# Patient Record
Sex: Male | Born: 1965 | Race: Black or African American | Hispanic: No | Marital: Married | Smoking: Never smoker
Health system: Southern US, Community
[De-identification: ages and names within clinical notes are randomized; demographics above are authoritative.]

## PROBLEM LIST (undated history)

## (undated) ENCOUNTER — Ambulatory Visit (HOSPITAL_COMMUNITY): Admission: EM | Payer: BC Managed Care – PPO | Source: Home / Self Care

## (undated) DIAGNOSIS — I10 Essential (primary) hypertension: Secondary | ICD-10-CM

## (undated) HISTORY — PX: NO PAST SURGERIES: SHX2092

---

## 2018-03-02 DIAGNOSIS — Z3009 Encounter for other general counseling and advice on contraception: Secondary | ICD-10-CM | POA: Diagnosis not present

## 2018-03-02 DIAGNOSIS — Z1388 Encounter for screening for disorder due to exposure to contaminants: Secondary | ICD-10-CM | POA: Diagnosis not present

## 2018-03-02 DIAGNOSIS — Z23 Encounter for immunization: Secondary | ICD-10-CM | POA: Diagnosis not present

## 2018-03-02 DIAGNOSIS — Z206 Contact with and (suspected) exposure to human immunodeficiency virus [HIV]: Secondary | ICD-10-CM | POA: Diagnosis not present

## 2018-03-02 DIAGNOSIS — Z0000 Encounter for general adult medical examination without abnormal findings: Secondary | ICD-10-CM | POA: Diagnosis not present

## 2018-03-02 DIAGNOSIS — Z0389 Encounter for observation for other suspected diseases and conditions ruled out: Secondary | ICD-10-CM | POA: Diagnosis not present

## 2018-03-02 DIAGNOSIS — Z049 Encounter for examination and observation for unspecified reason: Secondary | ICD-10-CM | POA: Diagnosis not present

## 2018-04-15 ENCOUNTER — Ambulatory Visit (HOSPITAL_COMMUNITY)
Admission: EM | Admit: 2018-04-15 | Discharge: 2018-04-15 | Disposition: A | Discharge status: Home / Self Care | Payer: Medicaid Other | Attending: Internal Medicine | Admitting: Internal Medicine

## 2018-04-15 ENCOUNTER — Encounter (HOSPITAL_COMMUNITY): Payer: Self-pay | Admitting: *Deleted

## 2018-04-15 ENCOUNTER — Other Ambulatory Visit: Payer: Self-pay

## 2018-04-15 DIAGNOSIS — R51 Headache: Secondary | ICD-10-CM | POA: Diagnosis not present

## 2018-04-15 DIAGNOSIS — R519 Headache, unspecified: Secondary | ICD-10-CM

## 2018-04-15 MED ORDER — FLUORESCEIN SODIUM 1 MG OP STRP
ORAL_STRIP | OPHTHALMIC | Status: AC
  Filled 2018-04-15: qty 1

## 2018-04-15 MED ORDER — KETOROLAC TROMETHAMINE 60 MG/2ML IM SOLN
60.0000 mg | Freq: Once | INTRAMUSCULAR | Status: AC
  Administered 2018-04-15: 60 mg via INTRAMUSCULAR

## 2018-04-15 MED ORDER — FLUTICASONE PROPIONATE 50 MCG/ACT NA SUSP: 1.0000 | Freq: Every day | NASAL | 2 refills | Status: DC

## 2018-04-15 MED ORDER — KETOROLAC TROMETHAMINE 60 MG/2ML IM SOLN
INTRAMUSCULAR | Status: AC
  Filled 2018-04-15: qty 2

## 2018-04-15 MED ORDER — NAPROXEN 500 MG PO TABS: 500.0000 mg | ORAL_TABLET | Freq: Two times a day (BID) | ORAL | 0 refills | Status: DC

## 2018-04-15 MED ORDER — TETRACAINE HCL 0.5 % OP SOLN
OPHTHALMIC | Status: AC
  Filled 2018-04-15: qty 4

## 2018-04-15 NOTE — ED Notes (Signed)
translator equipment in use creating a delay for this patient

## 2018-04-15 NOTE — Discharge Instructions (Signed)
It was nice meeting you!!  We treated you for a headache.  For worsening or more severe symptoms please go to the ER.

## 2018-04-15 NOTE — ED Triage Notes (Signed)
Pt complains of severe headache and sharp right eye pain started yesterday. Pt states vision is not clear to right eye.

## 2018-04-17 ENCOUNTER — Emergency Department (HOSPITAL_COMMUNITY)
Admission: EM | Admit: 2018-04-17 | Discharge: 2018-04-17 | Disposition: A | Discharge status: Home / Self Care | Payer: Medicaid Other | Attending: Emergency Medicine | Admitting: Emergency Medicine

## 2018-04-17 ENCOUNTER — Encounter (HOSPITAL_COMMUNITY): Payer: Self-pay | Admitting: Emergency Medicine

## 2018-04-17 ENCOUNTER — Ambulatory Visit (HOSPITAL_COMMUNITY)
Admission: EM | Admit: 2018-04-17 | Discharge: 2018-04-17 | Disposition: A | Discharge status: Home / Self Care | Payer: Medicaid Other

## 2018-04-17 ENCOUNTER — Emergency Department (HOSPITAL_COMMUNITY): Payer: Medicaid Other

## 2018-04-17 DIAGNOSIS — H5711 Ocular pain, right eye: Secondary | ICD-10-CM

## 2018-04-17 DIAGNOSIS — J019 Acute sinusitis, unspecified: Secondary | ICD-10-CM | POA: Insufficient documentation

## 2018-04-17 DIAGNOSIS — H538 Other visual disturbances: Secondary | ICD-10-CM | POA: Diagnosis not present

## 2018-04-17 LAB — CBC WITH DIFFERENTIAL/PLATELET
ABS IMMATURE GRANULOCYTES: 0 10*3/uL (ref 0.0–0.1)
Basophils Absolute: 0 10*3/uL (ref 0.0–0.1)
Basophils Relative: 1 %
Eosinophils Absolute: 0.1 10*3/uL (ref 0.0–0.7)
Eosinophils Relative: 2 %
HEMATOCRIT: 48.1 % (ref 39.0–52.0)
Hemoglobin: 16.2 g/dL (ref 13.0–17.0)
IMMATURE GRANULOCYTES: 0 %
LYMPHS ABS: 2.9 10*3/uL (ref 0.7–4.0)
Lymphocytes Relative: 38 %
MCH: 31.5 pg (ref 26.0–34.0)
MCHC: 33.7 g/dL (ref 30.0–36.0)
MCV: 93.4 fL (ref 78.0–100.0)
MONOS PCT: 15 %
Monocytes Absolute: 1.1 10*3/uL — ABNORMAL HIGH (ref 0.1–1.0)
NEUTROS ABS: 3.3 10*3/uL (ref 1.7–7.7)
NEUTROS PCT: 44 %
Platelets: 154 10*3/uL (ref 150–400)
RBC: 5.15 MIL/uL (ref 4.22–5.81)
RDW: 12.1 % (ref 11.5–15.5)
WBC: 7.5 10*3/uL (ref 4.0–10.5)

## 2018-04-17 LAB — BASIC METABOLIC PANEL
ANION GAP: 9 (ref 5–15)
BUN: 13 mg/dL (ref 6–20)
CHLORIDE: 105 mmol/L (ref 98–111)
CO2: 25 mmol/L (ref 22–32)
Calcium: 9 mg/dL (ref 8.9–10.3)
Creatinine, Ser: 0.87 mg/dL (ref 0.61–1.24)
GFR calc non Af Amer: >60 mL/min (ref >60)
Glucose, Bld: 112 mg/dL — ABNORMAL HIGH (ref 70–99)
Potassium: 3.3 mmol/L — ABNORMAL LOW (ref 3.5–5.1)
Sodium: 139 mmol/L (ref 135–145)

## 2018-04-17 LAB — SEDIMENTATION RATE: Sed Rate: 8 mm/hr (ref 0–16)

## 2018-04-17 MED ORDER — TETRACAINE HCL 0.5 % OP SOLN
1.0000 [drp] | Freq: Once | OPHTHALMIC | Status: AC
  Administered 2018-04-17: 1 [drp] via OPHTHALMIC
  Filled 2018-04-17: qty 4

## 2018-04-17 MED ORDER — FLUORESCEIN SODIUM 1 MG OP STRP
1.0000 | ORAL_STRIP | Freq: Once | OPHTHALMIC | Status: AC
  Administered 2018-04-17: 1 via OPHTHALMIC
  Filled 2018-04-17: qty 1

## 2018-04-17 MED ORDER — AMOXICILLIN-POT CLAVULANATE 875-125 MG PO TABS: 1.0000 | ORAL_TABLET | Freq: Two times a day (BID) | ORAL | 0 refills | Status: AC

## 2018-04-17 MED ORDER — IBUPROFEN 400 MG PO TABS
600.0000 mg | ORAL_TABLET | Freq: Once | ORAL | Status: AC
  Administered 2018-04-17: 600 mg via ORAL
  Filled 2018-04-17: qty 1

## 2018-04-17 MED ORDER — AMOXICILLIN-POT CLAVULANATE 875-125 MG PO TABS
1.0000 | ORAL_TABLET | Freq: Once | ORAL | Status: AC
  Administered 2018-04-17: 1 via ORAL
  Filled 2018-04-17: qty 1

## 2018-04-17 MED ORDER — HYDROCODONE-ACETAMINOPHEN 5-325 MG PO TABS
1.0000 | ORAL_TABLET | Freq: Once | ORAL | Status: AC
  Administered 2018-04-17: 1 via ORAL
  Filled 2018-04-17: qty 1

## 2018-04-17 NOTE — Discharge Instructions (Addendum)
Today your MRI of your head showed that you have a severe sinus infection.  This is causing you to have pain and pressure around your right eye.  The MRI of your brain did not show any evidence of a stroke or other significant abnormalities.   Please keep taking the nasal spray you have from urgent care.   Please take Ibuprofen (Advil, motrin) and Tylenol (acetaminophen) to relieve your pain.  You may take up to 600 MG (3 pills) of normal strength ibuprofen every 8 hours as needed.  In between doses of ibuprofen you make take tylenol, up to 1,000 mg (two extra strength pills).  Do not take more than 3,000 mg tylenol in a 24 hour period.  Please check all medication labels as many medications such as pain and cold medications may contain tylenol.  Do not drink alcohol while taking these medications.  Do not take other NSAID'S while taking ibuprofen (such as aleve or naproxen).  Please take ibuprofen with food to decrease stomach upset.  Today you received medications that may make you sleepy or impair your ability to make decisions.  For the next 24 hours please do not drive, operate heavy machinery, care for a small child with out another adult present, or perform any activities that may cause harm to you or someone else if you were to fall asleep or be impaired.   You may have diarrhea from the antibiotics.  It is very important that you continue to take the antibiotics even if you get diarrhea unless a medical professional tells you that you may stop taking them.  If you stop too early the bacteria you are being treated for will become stronger and you may need different, more powerful antibiotics that have more side effects and worsening diarrhea.  Please stay well hydrated and consider probiotics as they may decrease the severity of your diarrhea.

## 2018-04-17 NOTE — ED Notes (Signed)
Reports head hurts worse and feels like eye will pop[ out per translator service.  At prior visit instructions were to go to ed for any new or worsening symptoms.  Spoke to traci, np about patient.  Steward Drone, emt escorting patient to ed by wheelchair.

## 2018-04-17 NOTE — ED Provider Notes (Addendum)
[facility]    CSN: [id number] Arrival date & time: 04/15/18  1005     History   Chief Complaint Chief Complaint  Patient presents with  . Headache    HPI Bodie Crocco is a 52 y.o. male.   Patient is a 52 year old male that presents with severe right eye pain radiating into right side of head since yesterday about 9:00.  The pain has been worsening.  Describes it as a sharp stabbing pain 10 out of 10.  He denies any foreign body sensation.  He reports some blurred vision today in the eye.  He also reports some cold type symptoms with  congestion.  Denies any fever, chills, body aches.  Denies any injury to the eye.  He does wear glasses but does not have them today.  All information obtained using the translator. Pt not a very good historian. Hard to get a good H&P.   ROS per HPI      History reviewed. No pertinent past medical history.  There are no active problems to display for this patient.   History reviewed. No pertinent surgical history.     Home Medications    Prior to Admission medications   Medication Sig Start Date End Date Taking? Authorizing Provider  fluticasone (FLONASE) 50 MCG/ACT nasal spray Place 1 spray into both nostrils daily. 04/15/18   Dahlia Byes A, NP  naproxen (NAPROSYN) 500 MG tablet Take 1 tablet (500 mg total) by mouth 2 (two) times daily. 04/15/18   Janace Aris, NP    Family History History reviewed. No pertinent family history.  Social History Social History   Tobacco Use  . Smoking status: Not on file  Substance Use Topics  . Alcohol use: Not on file  . Drug use: Not on file     Allergies   Patient has no allergy information on record.   Review of Systems Review of Systems   Physical Exam Triage Vital Signs ED Triage Vitals  Enc Vitals Group     BP 04/15/18 1043 (!) 151/82     Pulse Rate 04/15/18 1043 82     Resp 04/15/18 1043 18     Temp 04/15/18 1043 98.2 F (36.8 C)     Temp Source 04/15/18  1043 Oral     SpO2 04/15/18 1043 99 %     Weight --      Height --      Head Circumference --      Peak Flow --      Pain Score 04/15/18 1049 9     Pain Loc --      Pain Edu? --      Excl. in GC? --    No data found.  Updated Vital Signs BP (!) 151/82   Pulse 82   Temp 98.2 F (36.8 C) (Oral)   Resp 18   SpO2 99%   Visual Acuity Right Eye Distance: patient could not determine even top line for visual acuity test Left Eye Distance:   Bilateral Distance:    Right Eye Near:   Left Eye Near:    Bilateral Near:     Physical Exam  Constitutional: He is oriented to person, place, and time. He appears well-developed and well-nourished.  Pt in distress with pain. Holding eye.   HENT:  Head: Normocephalic and atraumatic.  Eyes: Pupils are equal, round, and reactive to light. EOM are normal.  Bilateral scleral injection, more on the right. Tearing to right eye.  Pt pointing at head also.   Neck: Normal range of motion.  Pulmonary/Chest: Effort normal.  Musculoskeletal: Normal range of motion.  Neurological: He is alert and oriented to person, place, and time. He has normal strength. He is not disoriented. No sensory deficit.  No focal neuro deficits on exam. No facial droop. No obvious one sided weakness. Strength 4/5. Pt walking in room with normal gait.   Skin: Skin is warm and dry. Capillary refill takes less than 2 seconds.  Psychiatric: His mood appears anxious.  Nursing note and vitals reviewed.    UC Treatments / Results  Labs (all labs ordered are listed, but only abnormal results are displayed) Labs Reviewed - No data to display  EKG None  Radiology No results found.  Procedures Procedures (including critical care time)  Medications Ordered in UC Medications  ketorolac (TORADOL) injection 60 mg (60 mg Intramuscular Given 04/15/18 1136)    Initial Impression / Assessment and Plan / UC Course  I have reviewed the triage vital signs and the nursing  notes.  Pertinent labs & imaging results that were available during my care of the patient were reviewed by me and considered in my medical decision making (see chart for details).     Tetracaine eye drops applied to the right eye to prepare for fluorescence exam.  Went back in exam room to reassess and patient still having severe pain pointing to right and moving hand along right side of head where pain is radiating.  Toradol injection given for pain. Very hard to examine patient with translator. He keeps repeating himself when asking him questions.  Reassessed and pain  Decreased.  patient looking much more comfortable. Mostly likely cluster headache do to abscent foreign body sensation and sharp stabbing pain in the eye.  Pt also reporting cold type symptoms.  Flonase given to help if this is sinus and allergy related.  Naproxen for continued H/A.  No focal neuro deficits on exam.  No concern for stroke. Instructed ptto go to the ER for worsening symptoms.    All information obtained using the translator. Pt not a very good historian. Hard to get a hx.   Final Clinical Impressions(s) / UC Diagnoses   Final diagnoses:  Bad headache     Discharge Instructions     It was nice meeting you!!  We treated you for a headache.  For worsening or more severe symptoms please go to the ER.     ED Prescriptions    Medication Sig Dispense Auth. Provider   fluticasone (FLONASE) 50 MCG/ACT nasal spray Place 1 spray into both nostrils daily. 16 g Kashius Dominic A, NP   naproxen (NAPROSYN) 500 MG tablet Take 1 tablet (500 mg total) by mouth 2 (two) times daily. 30 tablet Dahlia Byes A, NP     Controlled Substance Prescriptions [place] Controlled Substance Registry consulted? Not Applicable        Janace Aris, NP 04/17/18 1032

## 2018-04-17 NOTE — ED Notes (Signed)
[facility] Spaniel #[id number] translator

## 2018-04-17 NOTE — ED Triage Notes (Signed)
Pt reports sided eye pain since Friday, seen at [facility] Saturday for the same, was given some rx that did not help. Pt denies vision issues in right eye. Has hx of the same a few years ago, unsure of diagnosis

## 2018-04-17 NOTE — ED Provider Notes (Addendum)
Patient presents today for evaluation of right eye pain, I assumed care of this patient from Andre Melia, PA-C at shift change, please see her note for full H&P.  He has had a cold with pain around his right eye, and has a new eyelid droop on exam today.   Physical Exam  BP 134/89 (BP Location: Right Arm)   Pulse 81   Temp 97.6 F (36.4 C) (Oral)   Resp 16   SpO2 98%   Physical Exam  Constitutional: He is oriented to person, place, and time. He appears well-developed and well-nourished. No distress.  HENT:  Head: Normocephalic and atraumatic.  Eyes: Conjunctivae are normal. Right eye exhibits no discharge. Left eye exhibits no discharge. No scleral icterus.  Slight eye lid droop on right side.    Neck: Normal range of motion.  Cardiovascular: Normal rate and regular rhythm.  Pulmonary/Chest: Effort normal. No stridor. No respiratory distress.  Abdominal: He exhibits no distension.  Musculoskeletal: He exhibits no edema or deformity.  Neurological: He is alert and oriented to person, place, and time. He exhibits normal muscle tone.  Skin: Skin is warm and dry. He is not diaphoretic.  Psychiatric: He has a normal mood and affect. His behavior is normal.  Nursing note and vitals reviewed.   ED Course/Procedures   Clinical Course as of Apr 19 1018  Mon Apr 17, 2018  1139 Visual acuity right eye 20/50, left eye 20/20, 20/20 both.   [LM]  7925 52 year old male presents with complaint of pain in his right eye.  Interpreter used for history and physical exam.  Intraocular pressures are normal, cranial nerve exam unremarkable with the exception of ptosis of the right upper eyelid.  Case discussed with Dr. Jacqulyn Bath, ER attending, recommends consult with ophthalmology.  Case was discussed with ophthalmology on-call who recommends MRI, without vision complaints does not feel that this is necessarily an ophthalmology patient but is available if needed based on MRI findings.   [LM]  1452 Care  signed out to [place], [place] pending MRI reports.   [LM]  1544 Patient not in room.    [EH]    Clinical Course User Index [EH] Cristina Gong, PA-C [LM] Alden Hipp    Procedures  Mr Brain Wo Contrast  Result Date: 04/17/2018 CLINICAL DATA:  Headache. Right eye pain beginning yesterday. Blurred vision EXAM: MRI HEAD AND ORBITS WITHOUT CONTRAST TECHNIQUE: Multiplanar, multiecho pulse sequences of the brain and surrounding structures were obtained without intravenous contrast. Multiplanar, multiecho pulse sequences of the orbits and surrounding structures were obtained including fat saturation techniques, without intravenous contrast administration. COMPARISON:  None. FINDINGS: MRI HEAD FINDINGS Brain: Diffusion imaging does not show any acute or subacute infarction. Cerebellar tonsils extend 2 mm through the foramen magnum, at the limits of normal and not sufficient for diagnosis Chiari malformation. The brainstem and cerebellum are otherwise normal. Cerebral hemispheres are normal without evidence of old or acute infarction, mass lesion, hemorrhage, hydrocephalus or extra-axial collection. Vascular: Major vessels at the base of the brain show flow. Skull and upper cervical spine: Hypercellular marrow pattern of the cervical spine, usually benign. Other: See below for description of sinus disease. MRI ORBITS FINDINGS Orbits: Both globes appear normal. The examination suffers from rather considerable motion degradation, but I believe the optic nerves are normal. Extra-ocular muscles are normal. Orbital fat is normal. No abnormality is seen at either orbital apex. Visualized sinuses: The patient has inflammatory sinus disease of the right frontal sinus including  a completely opacified far lateral component. Additionally, the right ethmoid sinus is opacified. There is mucosal thickening of the maxillary sinuses. This sinus disease could relate to the patient's symptoms. Soft tissues: Other  regional soft tissues appear unremarkable. Limited intracranial: Negative as above. IMPRESSION: Normal appearance of the brain itself. Inflammatory disease of the paranasal sinuses most pronounced affecting the right frontal and right ethmoid sinuses, which could relate to the patient's symptoms. Orbital imaging is graded by motion, but I do not identify any intraorbital pathology. Electronically Signed   By: Paulina Fusi M.D.   On: 04/17/2018 15:16   Mr Birdie Hopes ZO Contrast  Result Date: 04/17/2018 CLINICAL DATA:  Headache. Right eye pain beginning yesterday. Blurred vision EXAM: MRI HEAD AND ORBITS WITHOUT CONTRAST TECHNIQUE: Multiplanar, multiecho pulse sequences of the brain and surrounding structures were obtained without intravenous contrast. Multiplanar, multiecho pulse sequences of the orbits and surrounding structures were obtained including fat saturation techniques, without intravenous contrast administration. COMPARISON:  None. FINDINGS: MRI HEAD FINDINGS Brain: Diffusion imaging does not show any acute or subacute infarction. Cerebellar tonsils extend 2 mm through the foramen magnum, at the limits of normal and not sufficient for diagnosis Chiari malformation. The brainstem and cerebellum are otherwise normal. Cerebral hemispheres are normal without evidence of old or acute infarction, mass lesion, hemorrhage, hydrocephalus or extra-axial collection. Vascular: Major vessels at the base of the brain show flow. Skull and upper cervical spine: Hypercellular marrow pattern of the cervical spine, usually benign. Other: See below for description of sinus disease. MRI ORBITS FINDINGS Orbits: Both globes appear normal. The examination suffers from rather considerable motion degradation, but I believe the optic nerves are normal. Extra-ocular muscles are normal. Orbital fat is normal. No abnormality is seen at either orbital apex. Visualized sinuses: The patient has inflammatory sinus disease of the right  frontal sinus including a completely opacified far lateral component. Additionally, the right ethmoid sinus is opacified. There is mucosal thickening of the maxillary sinuses. This sinus disease could relate to the patient's symptoms. Soft tissues: Other regional soft tissues appear unremarkable. Limited intracranial: Negative as above. IMPRESSION: Normal appearance of the brain itself. Inflammatory disease of the paranasal sinuses most pronounced affecting the right frontal and right ethmoid sinuses, which could relate to the patient's symptoms. Orbital imaging is graded by motion, but I do not identify any intraorbital pathology. Electronically Signed   By: Paulina Fusi M.D.   On: 04/17/2018 15:16     MDM  Plan was to follow-up on MRIs of orbits and brain.  MRIs showed no significant intracranial abnormalities, patient does have extensive sinus disease, the right frontal sinus is inflamed with a completely opacified far lateral component along with right ethmoidal sinus opacification and mucosal thickening of the maxillary sinuses, suspect that this is the cause for patient's symptoms.  This patient was discussed with Dr. Jacqulyn Bath.  Will treat patient with p.o. Augmentin, and over-the-counter pain medicine.  Patient's diagnoses and treatment plans were discussed with the patient at length using Swahili video interpreter.  Return precautions were discussed with patient who states their understanding.  At the time of discharge patient denied any unaddressed complaints or concerns.  Patient is agreeable for discharge home.  Reports that he has a child who speaks both [place] and Swahili and can translate written English instructions for him.   Cristina Gong, [place] 04/17/18 1644    Maia Plan, MD 04/17/18 1953    Jeannie Fend, PA-C 04/19/18 1019  Maia Plan, MD 04/20/18 1319

## 2018-04-17 NOTE — ED Notes (Signed)
Translator needed

## 2018-04-17 NOTE — ED Notes (Signed)
Pt transported to MRI 

## 2018-04-17 NOTE — ED Provider Notes (Addendum)
[facility] EMERGENCY DEPARTMENT Provider Note   CSN: [id number] Arrival date & time: 04/17/18  0957     History   Chief Complaint Chief Complaint  Patient presents with  . Eye Problem    HPI Andre Wright is a 52 y.o. male.  52yo male presents to the ER, patient speaks swahili, interpreter used for visit. Patient was seen at Urgent Care 2 days ago (04/15/18), given tetracaine drops in the eye but pain persisted, was thought he had a headache related to his cold symptoms and was dc home with Flonase and Aleve.  Patient states he is sick today, pain in the right eye, shooting in nature, deep in the eye, pressure feeling. Onset Friday at 1AM while at work, got home and went to [facility] Saturday Morning, took medications as prescribed, pain increased yesterday. 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 New right lower eye lid droop. Denies any other pain or complaints (denies cough, cold, congestion, headache).     History reviewed. No pertinent past medical history.  There are no active problems to display for this patient.   No past surgical history on file.      Home Medications    Prior to Admission medications   Medication Sig Start Date End Date Taking? Authorizing Provider  amoxicillin-clavulanate (AUGMENTIN) 875-125 MG tablet Take 1 tablet by mouth every 12 (twelve) hours for 14 days. 04/17/18 05/01/18  Cristina Gong, PA-C  fluticasone [facility]) 50 MCG/ACT nasal spray Place 1 spray into both nostrils daily. 04/15/18   Dahlia Byes A, NP  naproxen (NAPROSYN) 500 MG tablet Take 1 tablet (500 mg total) by mouth 2 (two) times daily. 04/15/18   Janace Aris, NP    Family History No family history on file.  Social History Social History   Tobacco Use  . Smoking status: Not on file  Substance Use Topics  . Alcohol use: Not on file  . Drug use: Not on file      Allergies   Patient has no known allergies.   Review of Systems Review of Systems  Constitutional: Negative for chills and fever.  HENT: Negative for congestion, sinus pressure and sinus pain.   Eyes: Positive for pain. Negative for photophobia, discharge, redness, itching and visual disturbance.  Respiratory: Negative for cough.   Gastrointestinal: Negative for nausea and vomiting.  Musculoskeletal: Negative for neck pain and neck stiffness.  Skin: Negative for rash and wound.  Allergic/Immunologic: Negative for immunocompromised state.  Neurological: Positive for facial asymmetry. Negative for dizziness, speech difficulty, weakness and headaches.  Psychiatric/Behavioral: Negative for confusion.  All other systems reviewed and are negative.    Physical Exam Updated Vital Signs BP 134/89 (BP Location: Right Arm)   Pulse 81   Temp 97.6 F (36.4 C) (Oral)   Resp 16   SpO2 98%   Physical Exam  Constitutional: He is oriented to person, place, and time. He appears well-developed and well-nourished. No distress.  HENT:  Head: Atraumatic.  Right Ear: External ear normal.  Left Ear: External ear normal.  Nose: Nose normal.  Mouth/Throat: Oropharynx is clear and moist. No oropharyngeal exudate.  Eyes: Pupils are equal, round, and reactive to light. Conjunctivae and EOM are normal. Right eye exhibits no discharge. Left eye exhibits no discharge. Right conjunctiva is not injected. Right conjunctiva has no hemorrhage. Left conjunctiva is not injected. Left conjunctiva has no  hemorrhage. No scleral icterus. Right eye exhibits normal extraocular motion and no nystagmus. Left eye exhibits normal extraocular motion and no nystagmus.  Slit lamp exam:      The right eye shows no fluorescein uptake.  IOP 4 right and left. No fluorescein uptake, reports slight improvement in his pain with the tetracaine.   Neck: Normal range of motion. Neck supple.  Cardiovascular: Normal rate, regular  rhythm, normal heart sounds and intact distal pulses.  No murmur heard. Pulmonary/Chest: Effort normal and breath sounds normal. No respiratory distress.  Musculoskeletal: He exhibits no tenderness.  Neurological: He is alert and oriented to person, place, and time. He has normal strength. He is not disoriented. He displays normal reflexes. A cranial nerve deficit is present. No sensory deficit. GCS eye subscore is 4. GCS verbal subscore is 5. GCS motor subscore is 6.  Slight ptosis right upper eye lid  Skin: Skin is warm and dry. No rash noted. He is not diaphoretic.  Psychiatric: He has a normal mood and affect. His behavior is normal.  Nursing note and vitals reviewed.    ED Treatments / Results  Labs (all labs ordered are listed, but only abnormal results are displayed) Labs Reviewed  BASIC METABOLIC PANEL - Abnormal; Notable for the following components:      Result Value   Potassium 3.3 (*)    Glucose, Bld 112 (*)    All other components within normal limits  CBC WITH DIFFERENTIAL/PLATELET - Abnormal; Notable for the following components:   Monocytes Absolute 1.1 (*)    All other components within normal limits  SEDIMENTATION RATE    EKG None  Radiology No results found.  Procedures Procedures (including critical care time)  Medications Ordered in ED Medications  tetracaine (PONTOCAINE) 0.5 % ophthalmic solution 1 drop (1 drop Right Eye Given 04/17/18 1142)  fluorescein ophthalmic strip 1 strip (1 strip Right Eye Given 04/17/18 1142)  HYDROcodone-acetaminophen (NORCO/VICODIN) 5-325 MG per tablet 1 tablet (1 tablet Oral Given 04/17/18 1330)  ibuprofen (ADVIL,MOTRIN) tablet 600 mg (600 mg Oral Given 04/17/18 1652)  amoxicillin-clavulanate (AUGMENTIN) 875-125 MG per tablet 1 tablet (1 tablet Oral Given 04/17/18 1652)     Initial Impression / Assessment and Plan / ED Course  I have reviewed the triage vital signs and the nursing notes.  Pertinent labs & imaging results that  were available during my care of the patient were reviewed by me and considered in my medical decision making (see chart for details).  Clinical Course as of Apr 20 1808  [facility] Apr 17, 2018  1139 Visual acuity right eye 20/50, left eye 20/20, 20/20 both.   [LM]  7519 52 year old male presents with complaint of pain in his right eye.  Interpreter used for history and physical exam.  Intraocular pressures are normal, cranial nerve exam unremarkable with the exception of ptosis of the right upper eyelid.  Case discussed with Dr. Jacqulyn Bath, ER attending, recommends consult with ophthalmology.  Case was discussed with ophthalmology on-call who recommends MRI, without vision complaints does not feel that this is necessarily an ophthalmology patient but is available if needed based on MRI findings.   [LM]  1452 Care signed out to [place], [place] pending MRI reports.   [LM]  1544 Patient not in room.    [EH]    Clinical Course User Index [EH] Cristina Gong, PA-C [LM] Jeannie Fend, PA-C    Final Clinical Impressions(s) / ED Diagnoses   Final diagnoses:  Pain of right  eye  Acute sinusitis, recurrence not specified, unspecified location    ED Discharge Orders         Ordered    amoxicillin-clavulanate (AUGMENTIN) 875-125 MG tablet  Every 12 hours     04/17/18 1635           Jeannie Fend, PA-C 04/17/18 1453    Jeannie Fend, PA-C 04/20/18 1809    Long, Arlyss Repress, MD 04/21/18 1227

## 2018-04-17 NOTE — ED Notes (Signed)
ED Provider at bedside. 

## 2018-06-13 ENCOUNTER — Encounter: Payer: Self-pay | Admitting: Family Medicine

## 2018-06-13 ENCOUNTER — Ambulatory Visit (INDEPENDENT_AMBULATORY_CARE_PROVIDER_SITE_OTHER): Payer: Medicaid Other | Admitting: Family Medicine

## 2018-06-13 VITALS — BP 144/92 | HR 64 | Temp 97.6°F | Resp 17 | Ht 67.0 in | Wt 176.1 lb

## 2018-06-13 DIAGNOSIS — Z23 Encounter for immunization: Secondary | ICD-10-CM

## 2018-06-13 DIAGNOSIS — Z125 Encounter for screening for malignant neoplasm of prostate: Secondary | ICD-10-CM | POA: Diagnosis not present

## 2018-06-13 DIAGNOSIS — Z13220 Encounter for screening for lipoid disorders: Secondary | ICD-10-CM

## 2018-06-13 DIAGNOSIS — Z131 Encounter for screening for diabetes mellitus: Secondary | ICD-10-CM | POA: Diagnosis not present

## 2018-06-13 DIAGNOSIS — Z0000 Encounter for general adult medical examination without abnormal findings: Secondary | ICD-10-CM | POA: Diagnosis not present

## 2018-06-13 DIAGNOSIS — L309 Dermatitis, unspecified: Secondary | ICD-10-CM

## 2018-06-13 MED ORDER — TRIAMCINOLONE ACETONIDE 0.1 % EX CREA: 1.0000 "application " | TOPICAL_CREAM | Freq: Two times a day (BID) | CUTANEOUS | 0 refills | Status: DC

## 2018-06-13 MED ORDER — FLUTICASONE PROPIONATE 50 MCG/ACT NA SUSP: 1.0000 | Freq: Every day | NASAL | 1 refills | Status: AC

## 2018-06-13 NOTE — Patient Instructions (Signed)
Thank you for choosing [facility] at [facility] for your medical home!    Andre Wright was seen by Joaquin Courts, FNP today.   Andre Wright's primary care doctor is Bing Neighbors, FNP.   For the best care possible,  you should try to see Joaquin Courts, FNP-C  whenever you come to clinic.   We look forward to seeing you again soon!  If you have any questions about your visit today,  please call us at [phone]  Or feel free to reach your provider via MyChart.       Preventing Hypertension Hypertension, commonly called high blood pressure, is when the force of blood pumping through the arteries is too strong. Arteries are blood vessels that carry blood from the heart throughout the body. Over time, hypertension can damage the arteries and decrease blood flow to important parts of the body, including the brain, heart, and kidneys. Often, hypertension does not cause symptoms until blood pressure is very high. For this reason, it is important to have your blood pressure checked on a regular basis. Hypertension can often be prevented with diet and lifestyle changes. If you already have hypertension, you can control it with diet and lifestyle changes, as well as medicine. What nutrition changes can be made? Maintain a healthy diet. This includes:  Eating less salt (sodium). Ask your health care provider how much sodium is safe for you to have. The general recommendation is to consume less than 1 tsp (2,300 mg) of sodium a day. ? Do not add salt to your food. ? Choose low-sodium options when grocery shopping and eating out.  Limiting fats in your diet. You can do this by eating low-fat or fat-free dairy products and by eating less red meat.  Eating more fruits, vegetables, and whole grains. Make a goal to eat: ? 1-2 cups of fresh fruits and vegetables each day. ? 3-4 servings of whole grains each day.  Avoiding foods and beverages that have added  sugars.  Eating fish that contain healthy fats (omega-3 fatty acids), such as mackerel or salmon.  If you need help putting together a healthy eating plan, try the DASH diet. This diet is high in fruits, vegetables, and whole grains. It is low in sodium, red meat, and added sugars. DASH stands for Dietary Approaches to Stop Hypertension. What lifestyle changes can be made?  Lose weight if you are overweight. Losing just 3?5% of your body weight can help prevent or control hypertension. ? For example, if your present weight is 200 lb (91 kg), a loss of 3-5% of your weight means losing 6-10 lb (2.7-4.5 kg). ? Ask your health care provider to help you with a diet and exercise plan to safely lose weight.  Get enough exercise. Do at least 150 minutes of moderate-intensity exercise each week. ? You could do this in short exercise sessions several times a day, or you could do longer exercise sessions a few times a week. For example, you could take a brisk 10-minute walk or bike ride, 3 times a day, for 5 days a week.  Find ways to reduce stress, such as exercising, meditating, listening to music, or taking a yoga class. If you need help reducing stress, ask your health care provider.  Do not smoke. This includes e-cigarettes. Chemicals in tobacco and nicotine products raise your blood pressure each time you smoke. If you need help quitting, ask your health care provider.  Avoid alcohol. If you drink alcohol, limit alcohol  intake to no more than 1 drink a day for nonpregnant women and 2 drinks a day for men. One drink equals 12 oz of beer, 5 oz of wine, or 1 oz of hard liquor. Why are these changes important? Diet and lifestyle changes can help you prevent hypertension, and they may make you feel better overall and improve your quality of life. If you have hypertension, making these changes will help you control it and help prevent major complications, such as:  Hardening and narrowing of arteries  that supply blood to: ? Your heart. This can cause a heart attack. ? Your brain. This can cause a stroke. ? Your kidneys. This can cause kidney failure.  Stress on your heart muscle, which can cause heart failure.  What can I do to lower my risk?  Work with your health care provider to make a hypertension prevention plan that works for you. Follow your plan and keep all follow-up visits as told by your health care provider.  Learn how to check your blood pressure at home. Make sure that you know your personal target blood pressure, as told by your health care provider. How is this treated? In addition to diet and lifestyle changes, your health care provider may recommend medicines to help lower your blood pressure. You may need to try a few different medicines to find what works best for you. You also may need to take more than one medicine. Take over-the-counter and prescription medicines only as told by your health care provider. Where to find support: Your health care provider can help you prevent hypertension and help you keep your blood pressure at a healthy level. Your local hospital or your community may also provide support services and prevention programs. The American Heart Association offers an online support network at: https://www.lee.net/ Where to find more information: Learn more about hypertension from:  National Heart, Lung, and Blood Institute: https://www.peterson.org/  Centers for Disease Control and Prevention: AboutHD.co.nz  American Academy of Family Physicians: http://familydoctor.org/familydoctor/en/diseases-conditions/high-blood-pressure.printerview.all.html  Learn more about the DASH diet from:  National Heart, Lung, and Blood Institute: WedMap.it  Contact a health care provider if:  You think you are having a reaction to medicines you have  taken.  You have recurrent headaches or feel dizzy.  You have swelling in your ankles.  You have trouble with your vision. Summary  Hypertension often does not cause any symptoms until blood pressure is very high. It is important to get your blood pressure checked regularly.  Diet and lifestyle changes are the most important steps in preventing hypertension.  By keeping your blood pressure in a healthy range, you can prevent complications like heart attack, heart failure, stroke, and kidney failure.  Work with your health care provider to make a hypertension prevention plan that works for you. This information is not intended to replace advice given to you by your health care provider. Make sure you discuss any questions you have with your health care provider. Document Released: 08/10/2015 Document Revised: 04/05/2016 Document Reviewed: 04/05/2016 Elsevier Interactive Patient Education  [employer].

## 2018-06-13 NOTE — Progress Notes (Signed)
Andre Wright, is a 52 y.o. male  [id number]  [id number]  DOB - [birth year]  CC:  Chief Complaint  Patient presents with  . Establish Care    no concerns       HPI: Andre Wright is a 52 y.o. male is here today to establish care.   Andre Wright does not have a problem list on file.    Today's visit:  Patient was referred here by the social worker who is handling his immigration.  Patient is an immigrant from [place].  He denies knowledge of any previous health conditions.  No recent physical exam. No known family history of cardiovascular disease, hypertension, diabetes, or cancers.  My concern today is he is experiencing generalized itching.  Denies any known history of eczema or chronic dermatitis.  Reports no recent changes in detergents, foods, soaps, or exposure to plants.  He has not attempted relief with any over-the-counter preparations for itching. Patient denies fever, chills,new headaches, chest pain, abdominal pain, nausea, new weakness , numbness or tingling, SOB, edema, or worrisome cough.  He denies any other concerns or complaints today.  Current medications: Current Outpatient Medications:  .  fluticasone (FLONASE) 50 MCG/ACT nasal spray, Place 1 spray into both nostrils daily., Disp: 48 g, Rfl: 1 .  triamcinolone cream (KENALOG) 0.1 %, Apply 1 application topically 2 (two) times daily., Disp: 454 g, Rfl: 0   Pertinent family medical history: family history includes Healthy in his father and mother.   No Known Allergies  Social History   Socioeconomic History  . Marital status: Married    Spouse name: Not on file  . Number of children: Not on file  . Years of education: Not on file  . Highest education level: Not on file  Occupational History  . Not on file  Social Needs  . Financial resource strain: Not on file  . Food insecurity:    Worry: Not on file    Inability: Not on file  . Transportation needs:    Medical: Not on file    Non-medical:  Not on file  Tobacco Use  . Smoking status: Never Smoker  . Smokeless tobacco: Never Used  Substance and Sexual Activity  . Alcohol use: Never    Frequency: Never  . Drug use: Never  . Sexual activity: Yes  Lifestyle  . Physical activity:    Days per week: Not on file    Minutes per session: Not on file  . Stress: Not on file  Relationships  . Social connections:    Talks on phone: Not on file    Gets together: Not on file    Attends religious service: Not on file    Active member of club or organization: Not on file    Attends meetings of clubs or organizations: Not on file    Relationship status: Not on file  . Intimate partner violence:    Fear of current or ex partner: Not on file    Emotionally abused: Not on file    Physically abused: Not on file    Forced sexual activity: Not on file  Other Topics Concern  . Not on file  Social History Narrative  . Not on file    Review of Systems: Constitutional: Negative for fever, chills, diaphoresis, activity change, appetite change and fatigue. HENT: Negative for ear pain, nosebleeds, congestion, facial swelling, rhinorrhea, neck pain, neck stiffness and ear discharge.  Eyes: Negative for pain, discharge, redness, itching and visual disturbance. Respiratory: Negative for  cough, choking, chest tightness, shortness of breath, wheezing and stridor.  Cardiovascular: Negative for chest pain, palpitations and leg swelling. Gastrointestinal: Negative for abdominal distention. Genitourinary: Negative for dysuria, urgency, frequency, hematuria, flank pain, decreased urine volume, difficulty urinating. Musculoskeletal: Negative for back pain, joint swelling, arthralgia and gait problem. Neurological: Negative for dizziness, tremors, seizures, syncope, facial asymmetry, speech difficulty, weakness, light-headedness, numbness and headaches.  Hematological: Negative for adenopathy. Does not bruise/bleed easily. Psychiatric/Behavioral:  Negative for hallucinations, behavioral problems, confusion, dysphoric mood, decreased concentration and agitation.  Objective:   Vitals:   06/13/18 1124  BP: (!) 144/92  Pulse: 64  Resp: 17  Temp: 97.6 F (36.4 C)  SpO2: 96%    BP Readings from Last 3 Encounters:  06/13/18 (!) 144/92  04/17/18 134/89  04/17/18 (!) 155/84    Filed Weights   06/13/18 1124  Weight: 176 lb 2.4 oz (79.9 kg)      Physical Exam: Constitutional: Patient appears well-developed and well-nourished. No distress. HENT: Normocephalic, atraumatic, External right and left ear normal. Oropharynx is clear and moist.  Eyes: Conjunctivae and EOM are normal. PERRLA, no scleral icterus. Neck: Normal ROM. Neck supple. No JVD. No tracheal deviation. No thyromegaly. CVS: RRR, S1/S2 +, no murmurs, no gallops, no carotid bruit.  Pulmonary: Effort and breath sounds normal, no stridor, rhonchi, wheezes, rales.  Abdominal: Soft. BS +, no distension, tenderness, rebound or guarding.  Musculoskeletal: Normal range of motion. No edema and no tenderness.  Neuro: Alert. Normal muscle tone coordination. Normal gait. BUE and BLE strength 5/5. Bilateral hand grips symmetrical.No cranial nerve deficit. Skin: Skin is warm and dry. No rash noted. Not diaphoretic. No erythema. No pallor. Psychiatric: Normal mood and affect. Behavior, judgment, thought content normal.  Lab Results (prior encounters)  Lab Results  Component Value Date   WBC 7.5 04/17/2018   HGB 16.2 04/17/2018   HCT 48.1 04/17/2018   MCV 93.4 04/17/2018   PLT 154 04/17/2018   Lab Results  Component Value Date   CREATININE 0.87 04/17/2018   BUN 13 04/17/2018   NA 139 04/17/2018   K 3.3 (L) 04/17/2018   CL 105 04/17/2018   CO2 25 04/17/2018    No results found for: HGBA1C  No results found for: CHOL, TRIG, HDL, CHOLHDL, VLDL, LDLCALC      Assessment and plan:  1. Laboratory tests ordered as part of a complete physical exam (CPE) Blood pressure  slightly elevated today.  Patient reports no known history of hypertension.  For now we will continue to monitor blood pressure if it remains greater than 140/80 discussed with patient starting blood pressure medication.  Encourage routine physical activity and reducing salt intake. - Comprehensive metabolic panel - CBC with Differential - TSH  2. Screening PSA (prostate specific antigen) - PSA  3. Diabetes mellitus screening - Hemoglobin A1c  4. Lipid screening - Lipid Panel  5. Need for prophylactic vaccination and inoculation against influenza - Flu Vaccine QUAD 6+ mos PF IM (Fluarix Quad PF)  6. Dermatitis, new  -We will try triamcinolone cream 3 times daily as needed itching.   interpreter used to facilitate continuum of care and promote health.   Return 2 week BP check and 6 months routine wellness visit .   The patient was given clear instructions to go to ER or return to medical center if symptoms don't improve, worsen or new problems develop. The patient verbalized understanding. The patient was advised  to call and obtain lab results if they haven't  heard anything from out office within 7-10 business days.  Molli Barrows, FNP Primary Care at [facility] [address], [place] [address] [phone]: [phone]    This note has been created with Dragon speech recognition software and Engineer, materials. Any transcriptional errors are unintentional.

## 2018-06-15 LAB — CBC WITH DIFFERENTIAL/PLATELET
BASOS ABS: 0 10*3/uL (ref 0.0–0.2)
Basos: 0 %
EOS (ABSOLUTE): 0.2 10*3/uL (ref 0.0–0.4)
Eos: 4 %
Hematocrit: 46.9 % (ref 37.5–51.0)
Hemoglobin: 16.3 g/dL (ref 13.0–17.7)
IMMATURE GRANS (ABS): 0 10*3/uL (ref 0.0–0.1)
IMMATURE GRANULOCYTES: 0 %
Lymphocytes Absolute: 3.5 10*3/uL — ABNORMAL HIGH (ref 0.7–3.1)
Lymphs: 66 %
MCH: 31.2 pg (ref 26.6–33.0)
MCHC: 34.8 g/dL (ref 31.5–35.7)
MCV: 90 fL (ref 79–97)
MONOS ABS: 0.6 10*3/uL (ref 0.1–0.9)
Monocytes: 10 %
NEUTROS PCT: 20 %
Neutrophils Absolute: 1 10*3/uL — ABNORMAL LOW (ref 1.4–7.0)
PLATELETS: 180 10*3/uL (ref 150–450)
RBC: 5.23 x10E6/uL (ref 4.14–5.80)
RDW: 12.3 % (ref 12.3–15.4)
WBC: 5.3 10*3/uL (ref 3.4–10.8)

## 2018-06-15 LAB — COMPREHENSIVE METABOLIC PANEL
A/G RATIO: 1.4 (ref 1.2–2.2)
ALT: 36 IU/L (ref 0–44)
AST: 35 IU/L (ref 0–40)
Albumin: 4.1 g/dL (ref 3.5–5.5)
Alkaline Phosphatase: 111 IU/L (ref 39–117)
BILIRUBIN TOTAL: 0.4 mg/dL (ref 0.0–1.2)
BUN/Creatinine Ratio: 23 — ABNORMAL HIGH (ref 9–20)
BUN: 19 mg/dL (ref 6–24)
CHLORIDE: 102 mmol/L (ref 96–106)
CO2: 23 mmol/L (ref 20–29)
Calcium: 9.4 mg/dL (ref 8.7–10.2)
Creatinine, Ser: 0.82 mg/dL (ref 0.76–1.27)
GFR calc Af Amer: 118 mL/min/{1.73_m2} (ref >59)
GFR calc non Af Amer: 102 mL/min/{1.73_m2} (ref >59)
GLOBULIN, TOTAL: 2.9 g/dL (ref 1.5–4.5)
Glucose: 109 mg/dL — ABNORMAL HIGH (ref 65–99)
POTASSIUM: 4.3 mmol/L (ref 3.5–5.2)
SODIUM: 140 mmol/L (ref 134–144)
Total Protein: 7 g/dL (ref 6.0–8.5)

## 2018-06-15 LAB — HEMOGLOBIN A1C
Est. average glucose Bld gHb Est-mCnc: 114 mg/dL
HEMOGLOBIN A1C: 5.6 % (ref 4.8–5.6)

## 2018-06-15 LAB — PSA: Prostate Specific Ag, Serum: 0.6 ng/mL (ref 0.0–4.0)

## 2018-06-15 LAB — LIPID PANEL
Chol/HDL Ratio: 3 ratio (ref 0.0–5.0)
Cholesterol, Total: 106 mg/dL (ref 100–199)
HDL: 35 mg/dL — AB (ref >39)
LDL CALC: 53 mg/dL (ref 0–99)
TRIGLYCERIDES: 89 mg/dL (ref 0–149)
VLDL CHOLESTEROL CAL: 18 mg/dL (ref 5–40)

## 2018-06-15 LAB — TSH: TSH: 2.33 u[IU]/mL (ref 0.450–4.500)

## 2018-06-23 NOTE — Progress Notes (Signed)
Patient notified of results. Expressed understanding. Confirmed appointment for 11/19 @ 9:50.

## 2018-06-27 ENCOUNTER — Ambulatory Visit (INDEPENDENT_AMBULATORY_CARE_PROVIDER_SITE_OTHER): Payer: Medicaid Other | Admitting: Family Medicine

## 2018-06-27 VITALS — BP 147/87

## 2018-06-27 DIAGNOSIS — M25562 Pain in left knee: Secondary | ICD-10-CM

## 2018-06-27 DIAGNOSIS — M25561 Pain in right knee: Secondary | ICD-10-CM | POA: Diagnosis not present

## 2018-06-27 DIAGNOSIS — G4700 Insomnia, unspecified: Secondary | ICD-10-CM

## 2018-06-27 DIAGNOSIS — I10 Essential (primary) hypertension: Secondary | ICD-10-CM | POA: Diagnosis not present

## 2018-06-27 MED ORDER — LISINOPRIL 10 MG PO TABS: 10.0000 mg | ORAL_TABLET | Freq: Every day | ORAL | 3 refills | Status: DC

## 2018-06-27 NOTE — Patient Instructions (Addendum)
To aid in sleep, you may take diphenhydramine 25 mg one hour prior to bedtime. For knee pain, you may take ibuprofen 1-2 tablets over the counter every 8 hours as needed.    Hypertension Hypertension is another name for high blood pressure. High blood pressure forces your heart to work harder to pump blood. This can cause problems over time. There are two numbers in a blood pressure reading. There is a top number (systolic) over a bottom number (diastolic). It is best to have a blood pressure below 120/80. Healthy choices can help lower your blood pressure. You may need medicine to help lower your blood pressure if:  Your blood pressure cannot be lowered with healthy choices.  Your blood pressure is higher than 130/80.  Follow these instructions at home: Eating and drinking  If directed, follow the DASH eating plan. This diet includes: ? Filling half of your plate at each meal with fruits and vegetables. ? Filling one quarter of your plate at each meal with whole grains. Whole grains include whole wheat pasta, brown rice, and whole grain bread. ? Eating or drinking low-fat dairy products, such as skim milk or low-fat yogurt. ? Filling one quarter of your plate at each meal with low-fat (lean) proteins. Low-fat proteins include fish, skinless chicken, eggs, beans, and tofu. ? Avoiding fatty meat, cured and processed meat, or chicken with skin. ? Avoiding premade or processed food.  Eat less than 1,500 mg of salt (sodium) a day.  Limit alcohol use to no more than 1 drink a day for nonpregnant women and 2 drinks a day for men. One drink equals 12 oz of beer, 5 oz of wine, or 1 oz of hard liquor. Lifestyle  Work with your doctor to stay at a healthy weight or to lose weight. Ask your doctor what the best weight is for you.  Get at least 30 minutes of exercise that causes your heart to beat faster (aerobic exercise) most days of the week. This may include walking, swimming, or  biking.  Get at least 30 minutes of exercise that strengthens your muscles (resistance exercise) at least 3 days a week. This may include lifting weights or pilates.  Do not use any products that contain nicotine or tobacco. This includes cigarettes and e-cigarettes. If you need help quitting, ask your doctor.  Check your blood pressure at home as told by your doctor.  Keep all follow-up visits as told by your doctor. This is important. Medicines  Take over-the-counter and prescription medicines only as told by your doctor. Follow directions carefully.  Do not skip doses of blood pressure medicine. The medicine does not work as well if you skip doses. Skipping doses also puts you at risk for problems.  Ask your doctor about side effects or reactions to medicines that you should watch for. Contact a doctor if:  You think you are having a reaction to the medicine you are taking.  You have headaches that keep coming back (recurring).  You feel dizzy.  You have swelling in your ankles.  You have trouble with your vision. Get help right away if:  You get a very bad headache.  You start to feel confused.  You feel weak or numb.  You feel faint.  You get very bad pain in your: ? Chest. ? Belly (abdomen).  You throw up (vomit) more than once.  You have trouble breathing. Summary  Hypertension is another name for high blood pressure.  Making healthy choices can  help lower blood pressure. If your blood pressure cannot be controlled with healthy choices, you may need to take medicine. This information is not intended to replace advice given to you by your health care provider. Make sure you discuss any questions you have with your health care provider. Document Released: 01/12/2008 Document Revised: 06/23/2016 Document Reviewed: 06/23/2016 Elsevier Interactive Patient Education  [employer].

## 2018-06-28 NOTE — Progress Notes (Deleted)
Patient ID: Andre Wright, male    DOB: 15-Sep-1965, 52 y.o.   MRN: [id number]  PCP: Bing NeighborsHarris, Janika Jedlicka S, FNP  Chief Complaint  Patient presents with  . Blood Pressure Check    Subjective:  HPI  Andre Wright is a 52 y.o. male presents for blood pressure follow-up.   Social History   Socioeconomic History  . Marital status: Married    Spouse name: Not on file  . Number of children: Not on file  . Years of education: Not on file  . Highest education level: Not on file  Occupational History  . Not on file  Social Needs  . Financial resource strain: Not on file  . Food insecurity:    Worry: Not on file    Inability: Not on file  . Transportation needs:    Medical: Not on file    Non-medical: Not on file  Tobacco Use  . Smoking status: Never Smoker  . Smokeless tobacco: Never Used  Substance and Sexual Activity  . Alcohol use: Never    Frequency: Never  . Drug use: Never  . Sexual activity: Yes  Lifestyle  . Physical activity:    Days per week: Not on file    Minutes per session: Not on file  . Stress: Not on file  Relationships  . Social connections:    Talks on phone: Not on file    Gets together: Not on file    Attends religious service: Not on file    Active member of club or organization: Not on file    Attends meetings of clubs or organizations: Not on file    Relationship status: Not on file  . Intimate partner violence:    Fear of current or ex partner: Not on file    Emotionally abused: Not on file    Physically abused: Not on file    Forced sexual activity: Not on file  Other Topics Concern  . Not on file  Social History Narrative  . Not on file    Family History  Problem Relation Age of Onset  . Healthy Mother   . Healthy Father   . Stroke Neg Hx   . Heart disease Neg Hx   . Cancer Neg Hx      Review of Systems  There are no active problems to display for this patient.   No Known Allergies  Prior to Admission  medications   Medication Sig Start Date End Date Taking? Authorizing Provider  fluticasone (FLONASE) 50 MCG/ACT nasal spray Place 1 spray into both nostrils daily. 06/13/18   Bing NeighborsHarris, Keaghan Bowens S, FNP  lisinopril (PRINIVIL,ZESTRIL) 10 MG tablet Take 1 tablet (10 mg total) by mouth daily. 06/27/18   Bing NeighborsHarris, Josselyn Harkins S, FNP  triamcinolone cream (KENALOG) 0.1 % Apply 1 application topically 2 (two) times daily. 06/13/18   Bing NeighborsHarris, Tenise Stetler S, FNP    Past Medical, Surgical Family and Social History reviewed and updated.    Objective:   Today's Vitals   06/27/18 0943  BP: (!) 147/87    Wt Readings from Last 3 Encounters:  06/13/18 176 lb 2.4 oz (79.9 kg)     Physical Exam General appearance: alert, well developed, well nourished, cooperative and in no distress Head: Normocephalic, without obvious abnormality, atraumatic Respiratory: Respirations even and unlabored, normal respiratory rate Heart: rate and rhythm normal. No gallop or murmurs noted on exam  Extremities: No gross deformities Skin: Skin color, texture, turgor normal. No rashes seen  Psych: Appropriate mood and  affect. Neurologic: Mental status: Alert, oriented to person, place, and time, thought content appropriate.  No results found for: POCGLU  Lab Results  Component Value Date   HGBA1C 5.6 06/13/2018            Assessment & Plan:  1. Essential hypertension ***  2. Acute pain of both knees ***  3. Insomnia, unspecified type ***      -The patient was given clear instructions to go to ER or return to medical center if symptoms do not improve, worsen or new problems develop. The patient verbalized understanding.    Joaquin Courts, FNP Primary Care at [facility] [address], [place] [address] [phone]: [phone]

## 2018-07-03 NOTE — Progress Notes (Signed)
Subjective:  Andre Wright is a 52 y.o. male with elevated blood pressure readings x two visits. Aldan blood pressure has been checked > 4 times and readings have remained elevated greater than 140/80. He has no prior history of hypertension.  He denies chest pain, headaches, shortness of breath, dizziness or new weakness. He has no history of renal disease.  Current Outpatient Medications  Medication Sig Dispense Refill  . fluticasone (FLONASE) 50 MCG/ACT nasal spray Place 1 spray into both nostrils daily. 48 g 1  . lisinopril (PRINIVIL,ZESTRIL) 10 MG tablet Take 1 tablet (10 mg total) by mouth daily. 90 tablet 3  . triamcinolone cream (KENALOG) 0.1 % Apply 1 application topically 2 (two) times daily. 454 g 0   No current facility-administered medications for this visit.     Hypertension ROS: Pertinent negatives indicated in HPI.   Objective:  BP (!) 147/87   Appearance alert, well appearing, and in no distress. General exam BP noted to be well controlled today in office, S1, S2 normal, no gallop, no murmur, chest clear, no JVD, no HSM, no edema.  Lab review: labs are reviewed, up to date and normal.   Assessment/Plan:  Hypertension asymptomatic, uncontrolled. Initiating antihypertensive therapy with lisinopril 10 mg once daily.  We have discussed target BP range and blood pressure goal. I have advised patient to check BP regularly and to call us back or report to clinic if the numbers are consistently higher than 140/90. We discussed the importance of compliance with medical therapy and DASH diet recommended, consequences of uncontrolled hypertension discussed.   Meds ordered this encounter  Medications  . lisinopril (PRINIVIL,ZESTRIL) 10 MG tablet    Sig: Take 1 tablet (10 mg total) by mouth daily.    Dispense:  90 tablet    Refill:  3    Joaquin CourtsKimberly Sueo Cullen, FNP [facility] [address], [place] [id number] [phone]:  [phone]

## 2018-07-20 ENCOUNTER — Encounter (HOSPITAL_COMMUNITY): Payer: Self-pay | Admitting: Emergency Medicine

## 2018-07-20 ENCOUNTER — Ambulatory Visit (HOSPITAL_COMMUNITY)
Admission: EM | Admit: 2018-07-20 | Discharge: 2018-07-20 | Disposition: A | Discharge status: Home / Self Care | Payer: Medicaid Other | Attending: Family Medicine | Admitting: Family Medicine

## 2018-07-20 DIAGNOSIS — M25561 Pain in right knee: Secondary | ICD-10-CM | POA: Diagnosis not present

## 2018-07-20 DIAGNOSIS — N529 Male erectile dysfunction, unspecified: Secondary | ICD-10-CM | POA: Insufficient documentation

## 2018-07-20 HISTORY — DX: Essential (primary) hypertension: I10

## 2018-07-20 MED ORDER — NAPROXEN 500 MG PO TABS: 500.0000 mg | ORAL_TABLET | Freq: Two times a day (BID) | ORAL | 0 refills | Status: AC

## 2018-07-20 MED ORDER — SILDENAFIL CITRATE 50 MG PO TABS: ORAL_TABLET | ORAL | 0 refills | Status: AC

## 2018-07-20 NOTE — ED Notes (Signed)
Patient able to ambulate independently  

## 2018-07-20 NOTE — ED Provider Notes (Signed)
[facility]   [id number] 07/20/18 Arrival Time: [id number]  ASSESSMENT & PLAN:  1. Acute pain of right knee   2. Erectile dysfunction, unspecified erectile dysfunction type     Meds ordered this encounter  Medications  . naproxen (NAPROSYN) 500 MG tablet    Sig: Take 1 tablet (500 mg total) by mouth 2 (two) times daily. (Kumeza moja kidonge kila asubuhi na kila usiku kwa siku kumi.)    Dispense:  20 tablet    Refill:  0  . sildenafil (VIAGRA) 50 MG tablet    Sig: Take 1 tablet by mouth 1-4 hours before sexual activity. (Kumeza moja kidonge 1-4 masaa kabla ya shughuli ya ngono.)    Dispense:  10 tablet    Refill:  0  Trial of Viagra with understanding of need to f/u with PCP if this does not help.  Orders Placed This Encounter  Procedures  . Apply knee sleeve  To wear for the next 1-2 weeks with walking/standing.  Follow-up Information    Bing NeighborsHarris, Kimberly S, FNP.   Specialty:  Family Medicine Why:  If not improving over the next 1-2 weeks. Contact information: [address] [place] [address] [phone]        [facility].   Specialty:  Urgent Care Why:  As needed. Contact information: [address] [place] [id number] [phone]         Reviewed expectations re: course of current medical issues. Questions answered. Outlined signs and symptoms indicating need for more acute intervention. Patient verbalized understanding. After Visit Summary given.  SUBJECTIVE: History from: patient. Kiswahili video interpreter used.  Andre Wright is a 52 y.o. male who reports persistent mild to moderate pain of his right medial knee; described as aching with occasional sharp exacerbations, esp when climbing stairs; without radiation. Onset: gradual, over the past 1-2 weeks. Injury/trama: none identified. Symptoms have progressed to a point and plateaued since beginning. Aggravating factors:  prolonged walking/standing. Alleviating factors: rest. Associated symptoms: none reported. Extremity sensation changes or weakness: none. Self treatment: occasional iburprofen without much help. No locking or giving out of knee reported. History of similar: no.  Past Surgical History:  Procedure Laterality Date  . NO PAST SURGERIES      Also reports on/off inability to hold an erection over the past month or two. No stress/anxiety. Sexually active with his wife only. Normal libido. No new medications. No frequent alcohol use. No change in bowel/bladder habits. No significant fatigue. Weight stable. Sleeping well.  ROS: As per HPI. All other systems negative.   OBJECTIVE:  Vitals:   07/20/18 1003  BP: (!) 165/95  Pulse: 70  Resp: 18  Temp: 98.2 F (36.8 C)  TempSrc: Oral  SpO2: 100%    General appearance: alert; no distress CV: RRR without murmer Lungs: CTAB Extremities: . RLE: warm and well perfused; fairly well localized moderate tenderness over right medial knee (over MCL distribution); without gross deformities; with no swelling; with no bruising; ROM: normal; no clicks or laxity appreciated with exam CV: brisk extremity capillary refill of RLE; 2+ DP and PT pulse of RLE. Abd: soft; non-tender Skin: warm and dry; no visible rashes GU: deferred Neurologic: gait normal; normal reflexes of RLE and LLE; normal sensation of RLE and LLE; normal strength of RLE and LLE Psychological: alert and cooperative; normal mood and affect  No Known Allergies  Past Medical History:  Diagnosis Date  . Hypertension  Social History   Socioeconomic History  . Marital status: Married    Spouse name: Not on file  . Number of children: Not on file  . Years of education: Not on file  . Highest education level: Not on file  Occupational History  . Not on file  Social Needs  . Financial resource strain: Not on file  . Food insecurity:    Worry: Not on file    Inability: Not on  file  . Transportation needs:    Medical: Not on file    Non-medical: Not on file  Tobacco Use  . Smoking status: Never Smoker  . Smokeless tobacco: Never Used  Substance and Sexual Activity  . Alcohol use: Never    Frequency: Never  . Drug use: Never  . Sexual activity: Yes  Lifestyle  . Physical activity:    Days per week: Not on file    Minutes per session: Not on file  . Stress: Not on file  Relationships  . Social connections:    Talks on phone: Not on file    Gets together: Not on file    Attends religious service: Not on file    Active member of club or organization: Not on file    Attends meetings of clubs or organizations: Not on file    Relationship status: Not on file  Other Topics Concern  . Not on file  Social History Narrative  . Not on file   Family History  Problem Relation Age of Onset  . Healthy Mother   . Healthy Father   . Stroke Neg Hx   . Heart disease Neg Hx   . Cancer Neg Hx    Past Surgical History:  Procedure Laterality Date  . NO PAST SURGERIES        Mardella Layman, MD 07/20/18 1106

## 2018-07-20 NOTE — ED Triage Notes (Signed)
[facility], right knee pain, only when he is ambulating, x 2-3 days.  Patient also states that when he started taking Lisinopril in November he began to have issues performing and having a sex drive.

## 2018-07-27 ENCOUNTER — Ambulatory Visit (INDEPENDENT_AMBULATORY_CARE_PROVIDER_SITE_OTHER): Payer: Medicaid Other

## 2018-07-27 ENCOUNTER — Ambulatory Visit: Payer: Medicaid Other

## 2018-07-27 VITALS — BP 149/86

## 2018-07-27 DIAGNOSIS — Z23 Encounter for immunization: Secondary | ICD-10-CM | POA: Diagnosis not present

## 2018-07-27 DIAGNOSIS — I10 Essential (primary) hypertension: Secondary | ICD-10-CM

## 2018-07-27 MED ORDER — CLONIDINE HCL 0.1 MG PO TABS
0.1000 mg | ORAL_TABLET | Freq: Once | ORAL | Status: AC
  Administered 2018-07-27: 0.1 mg via ORAL

## 2018-07-27 NOTE — Progress Notes (Signed)
BP elevated today. Ordered 0.1 mg clonidine.  BP rechecked. Patient stable for discharge and will return in 4 weeks for nurse visit BP check.

## 2018-08-15 DIAGNOSIS — M25561 Pain in right knee: Secondary | ICD-10-CM | POA: Diagnosis not present

## 2018-08-28 ENCOUNTER — Ambulatory Visit (INDEPENDENT_AMBULATORY_CARE_PROVIDER_SITE_OTHER): Payer: Medicaid Other

## 2018-08-28 VITALS — BP 151/89 | HR 68

## 2018-08-28 DIAGNOSIS — I10 Essential (primary) hypertension: Secondary | ICD-10-CM | POA: Diagnosis not present

## 2018-08-28 MED ORDER — LISINOPRIL 10 MG PO TABS: 10.0000 mg | ORAL_TABLET | Freq: Every day | ORAL | 3 refills | Status: DC

## 2018-08-28 NOTE — Progress Notes (Signed)
Patient here for BP check. States that he has been out of his BP medication for about 1 month due to not having anybody who spoke English able to call pharmacy to request refills. Spoke with provider & she resent his prescriptions to the pharmacy with a note stating to place prescription on auto refill. Would like to see patient back in 4 weeks. KWalker, CMA.

## 2018-09-20 ENCOUNTER — Ambulatory Visit: Payer: Medicaid Other

## 2018-09-26 ENCOUNTER — Ambulatory Visit: Payer: Medicaid Other

## 2018-09-26 ENCOUNTER — Ambulatory Visit (INDEPENDENT_AMBULATORY_CARE_PROVIDER_SITE_OTHER): Payer: Medicaid Other | Admitting: Family Medicine

## 2018-09-26 VITALS — BP 157/87 | HR 66

## 2018-09-26 DIAGNOSIS — I10 Essential (primary) hypertension: Secondary | ICD-10-CM

## 2018-09-26 MED ORDER — LISINOPRIL 30 MG PO TABS: 30.0000 mg | ORAL_TABLET | Freq: Every day | ORAL | 2 refills | Status: DC

## 2018-09-26 NOTE — Progress Notes (Signed)
Assessment: Blood pressure (!) 157/87, pulse 66. Blood pressure remains uncontrolled. Increasing lisinopril 30 mg once daily.  Plan: Schedule a follow-up with provider in 6 weeks for evaluation of blood pressure.   Joaquin Courts, FNP-C

## 2018-09-26 NOTE — Progress Notes (Deleted)
Increasing lisinopril 30 mg once daily.  Schedule a follow-up with provider in 6 weeks for evaluation of blood pressure.

## 2018-09-26 NOTE — Progress Notes (Signed)
Patient here for BP check. Has taken BP medication this morning. Denies chest pain, shortness of breath, headaches & lower extremity swelling. Spoke with provider & she wants to increase medication to 30 mg daily & return in 6 weeks. Patient repeated back new dose instructions correctly. KWalker, CMA.

## 2018-09-26 NOTE — Patient Instructions (Addendum)
Increased Lisinopril 30 mg daily.  Take medication prior to next scheduled visit.   Hypertension Hypertension, commonly called high blood pressure, is when the force of blood pumping through the arteries is too strong. The arteries are the blood vessels that carry blood from the heart throughout the body. Hypertension forces the heart to work harder to pump blood and may cause arteries to become narrow or stiff. Having untreated or uncontrolled hypertension can cause heart attacks, strokes, kidney disease, and other problems. A blood pressure reading consists of a higher number over a lower number. Ideally, your blood pressure should be below 120/80. The first ("top") number is called the systolic pressure. It is a measure of the pressure in your arteries as your heart beats. The second ("bottom") number is called the diastolic pressure. It is a measure of the pressure in your arteries as the heart relaxes. What are the causes? The cause of this condition is not known. What increases the risk? Some risk factors for high blood pressure are under your control. Others are not. Factors you can change  Smoking.  Having type 2 diabetes mellitus, high cholesterol, or both.  Not getting enough exercise or physical activity.  Being overweight.  Having too much fat, sugar, calories, or salt (sodium) in your diet.  Drinking too much alcohol. Factors that are difficult or impossible to change  Having chronic kidney disease.  Having a family history of high blood pressure.  Age. Risk increases with age.  Race. You may be at higher risk if you are African-American.  Gender. Men are at higher risk than women before age 74. After age 60, women are at higher risk than men.  Having obstructive sleep apnea.  Stress. What are the signs or symptoms? Extremely high blood pressure (hypertensive crisis) may cause:  Headache.  Anxiety.  Shortness of breath.  Nosebleed.  Nausea and  vomiting.  Severe chest pain.  Jerky movements you cannot control (seizures). How is this diagnosed? This condition is diagnosed by measuring your blood pressure while you are seated, with your arm resting on a surface. The cuff of the blood pressure monitor will be placed directly against the skin of your upper arm at the level of your heart. It should be measured at least twice using the same arm. Certain conditions can cause a difference in blood pressure between your right and left arms. Certain factors can cause blood pressure readings to be lower or higher than normal (elevated) for a short period of time:  When your blood pressure is higher when you are in a health care provider's office than when you are at home, this is called white coat hypertension. Most people with this condition do not need medicines.  When your blood pressure is higher at home than when you are in a health care provider's office, this is called masked hypertension. Most people with this condition may need medicines to control blood pressure. If you have a high blood pressure reading during one visit or you have normal blood pressure with other risk factors:  You may be asked to return on a different day to have your blood pressure checked again.  You may be asked to monitor your blood pressure at home for 1 week or longer. If you are diagnosed with hypertension, you may have other blood or imaging tests to help your health care provider understand your overall risk for other conditions. How is this treated? This condition is treated by making healthy lifestyle changes, such  as eating healthy foods, exercising more, and reducing your alcohol intake. Your health care provider may prescribe medicine if lifestyle changes are not enough to get your blood pressure under control, and if:  Your systolic blood pressure is above 130.  Your diastolic blood pressure is above 80. Your personal target blood pressure may vary  depending on your medical conditions, your age, and other factors. Follow these instructions at home: Eating and drinking   Eat a diet that is high in fiber and potassium, and low in sodium, added sugar, and fat. An example eating plan is called the DASH (Dietary Approaches to Stop Hypertension) diet. To eat this way: ? Eat plenty of fresh fruits and vegetables. Try to fill half of your plate at each meal with fruits and vegetables. ? Eat whole grains, such as whole wheat pasta, brown rice, or whole grain bread. Fill about one quarter of your plate with whole grains. ? Eat or drink low-fat dairy products, such as skim milk or low-fat yogurt. ? Avoid fatty cuts of meat, processed or cured meats, and poultry with skin. Fill about one quarter of your plate with lean proteins, such as fish, chicken without skin, beans, eggs, and tofu. ? Avoid premade and processed foods. These tend to be higher in sodium, added sugar, and fat.  Reduce your daily sodium intake. Most people with hypertension should eat less than 1,500 mg of sodium a day.  Limit alcohol intake to no more than 1 drink a day for nonpregnant women and 2 drinks a day for men. One drink equals 12 oz of beer, 5 oz of Leth, or 1 oz of hard liquor. Lifestyle   Work with your health care provider to maintain a healthy body weight or to lose weight. Ask what an ideal weight is for you.  Get at least 30 minutes of exercise that causes your heart to beat faster (aerobic exercise) most days of the week. Activities may include walking, swimming, or biking.  Include exercise to strengthen your muscles (resistance exercise), such as pilates or lifting weights, as part of your weekly exercise routine. Try to do these types of exercises for 30 minutes at least 3 days a week.  Do not use any products that contain nicotine or tobacco, such as cigarettes and e-cigarettes. If you need help quitting, ask your health care provider.  Monitor your blood  pressure at home as told by your health care provider.  Keep all follow-up visits as told by your health care provider. This is important. Medicines  Take over-the-counter and prescription medicines only as told by your health care provider. Follow directions carefully. Blood pressure medicines must be taken as prescribed.  Do not skip doses of blood pressure medicine. Doing this puts you at risk for problems and can make the medicine less effective.  Ask your health care provider about side effects or reactions to medicines that you should watch for. Contact a health care provider if:  You think you are having a reaction to a medicine you are taking.  You have headaches that keep coming back (recurring).  You feel dizzy.  You have swelling in your ankles.  You have trouble with your vision. Get help right away if:  You develop a severe headache or confusion.  You have unusual weakness or numbness.  You feel faint.  You have severe pain in your chest or abdomen.  You vomit repeatedly.  You have trouble breathing. Summary  Hypertension is when the force of  blood pumping through your arteries is too strong. If this condition is not controlled, it may put you at risk for serious complications.  Your personal target blood pressure may vary depending on your medical conditions, your age, and other factors. For most people, a normal blood pressure is less than 120/80.  Hypertension is treated with lifestyle changes, medicines, or a combination of both. Lifestyle changes include weight loss, eating a healthy, low-sodium diet, exercising more, and limiting alcohol. This information is not intended to replace advice given to you by your health care provider. Make sure you discuss any questions you have with your health care provider. Document Released: 07/26/2005 Document Revised: 06/23/2016 Document Reviewed: 06/23/2016 Elsevier Interactive Patient Education  2019 [employer].

## 2018-11-07 ENCOUNTER — Ambulatory Visit: Payer: Medicaid Other | Admitting: Family Medicine

## 2019-01-24 IMAGING — MR MR ORBITS W/O CM
11 of 15 series · 30 of 48 positions shown · non-contrast
Comparison: None.

CLINICAL DATA: Headache. Right eye pain beginning yesterday.
Blurred vision

EXAM:
MRI HEAD AND ORBITS WITHOUT CONTRAST
TECHNIQUE: Multiplanar, multiecho pulse sequences of the brain and surrounding
structures were obtained without intravenous contrast. Multiplanar,
multiecho pulse sequences of the orbits and surrounding structures
were obtained including fat saturation techniques, without
intravenous contrast administration.

[Series 3: DWI · axial · 3.0mm · 0.94mm/px · z∈[-50,+96]mm · 7 of 100 slices shown (1 of 2)]
[im 1/100]
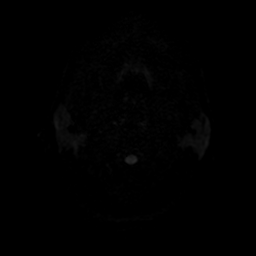
[im 17/100]
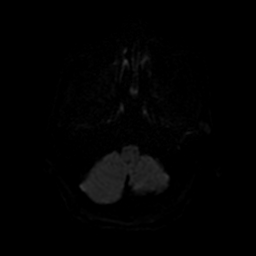
[im 34/100]
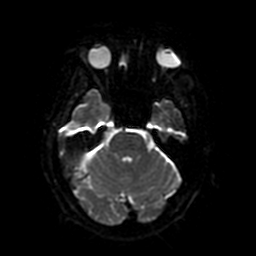
[im 50/100]
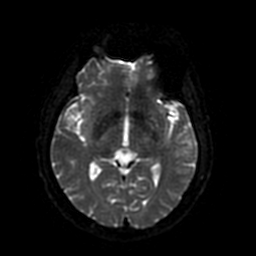
[im 67/100]
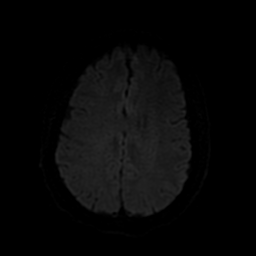
[im 83/100]
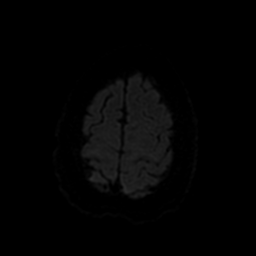
[im 100/100]
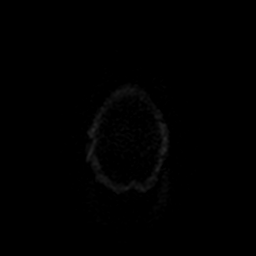

[Series 4: DWI · coronal · 4.0mm · 0.94mm/px · 5 of 71 slices shown (2 of 2)]
[im 1/71]
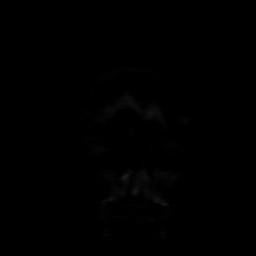
[im 18/71]
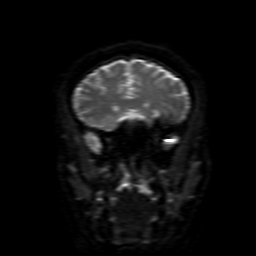
[im 36/71]
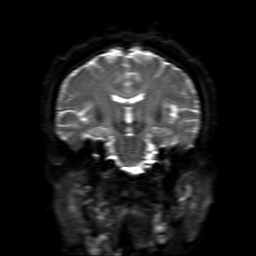
[im 53/71]
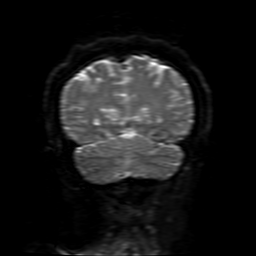
[im 71/71]
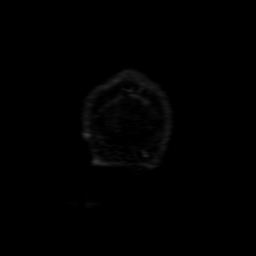

[Series 5: FLAIR · sagittal · 5.0mm · 0.47mm/px · 2 of 25 slices shown (1 of 2)]
[im 1/25]
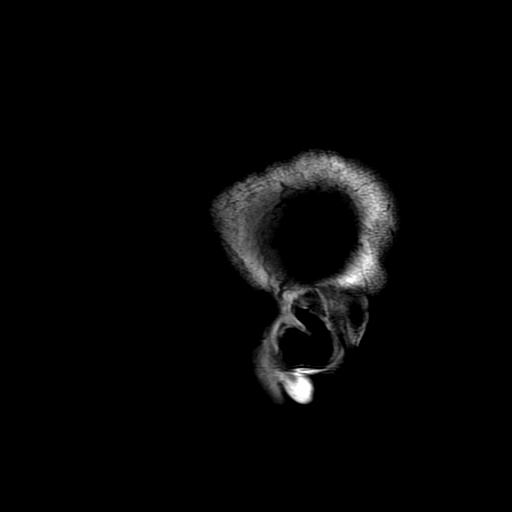
[im 25/25]
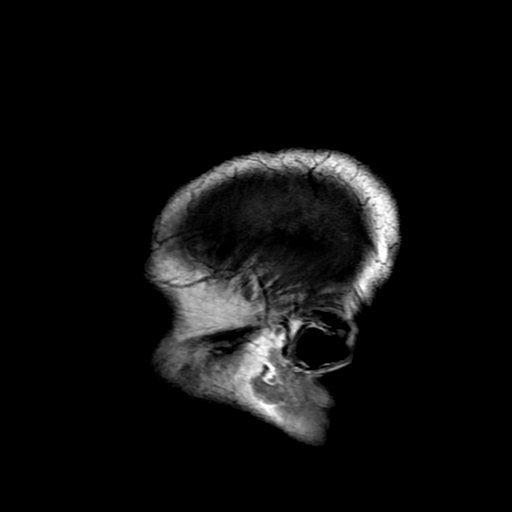

[Series 6: T2 · axial · 5.0mm · 0.47mm/px · z∈[-44,+105]mm · 2 of 26 slices shown (1 of 2)]
[im 1/26]
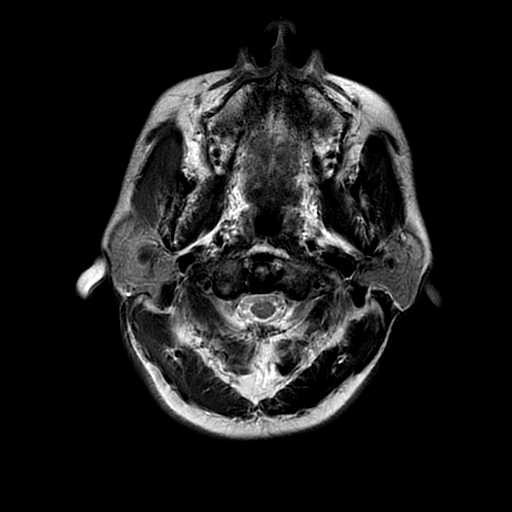
[im 26/26]
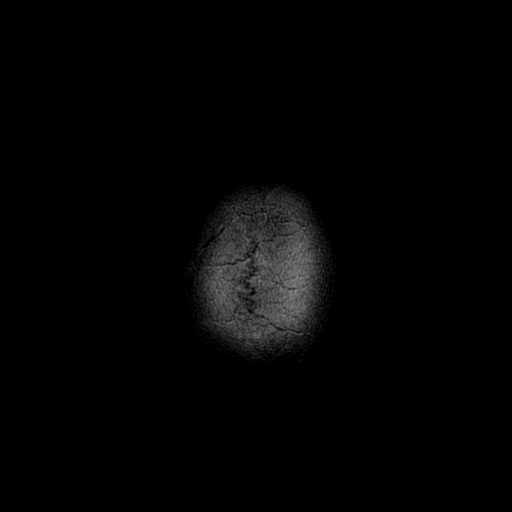

[Series 7: FLAIR · axial · 5.0mm · 0.47mm/px · z∈[-44,+105]mm · 2 of 26 slices shown (2 of 2)]
[im 1/26]
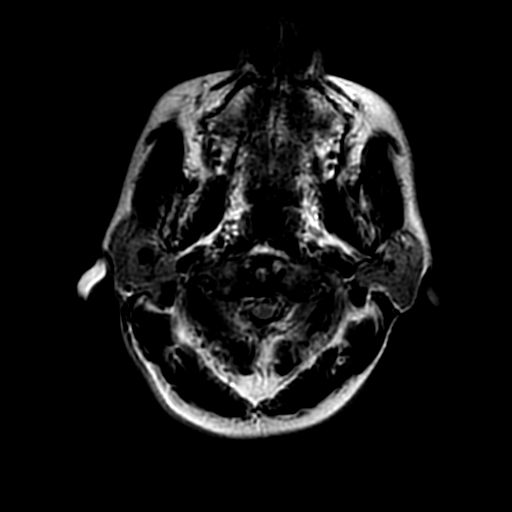
[im 26/26]
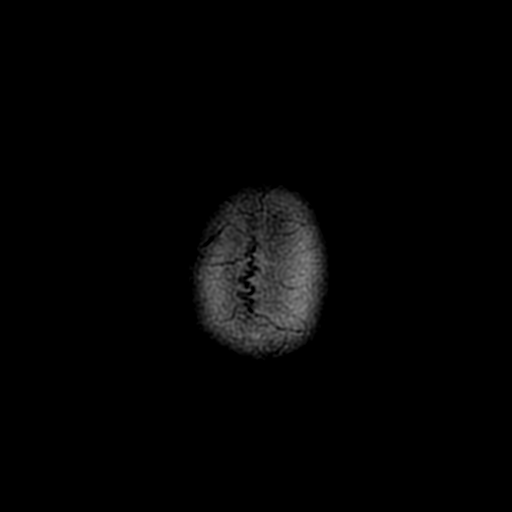

[Series 10: T2 · coronal · 5.0mm · 0.39mm/px · 2 of 32 slices shown (2 of 2)]
[im 1/32]
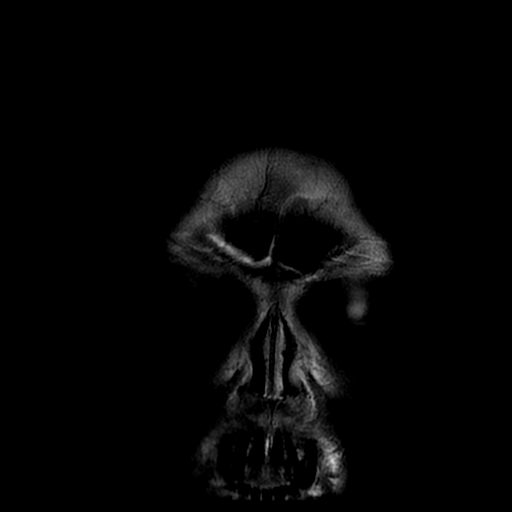
[im 32/32]
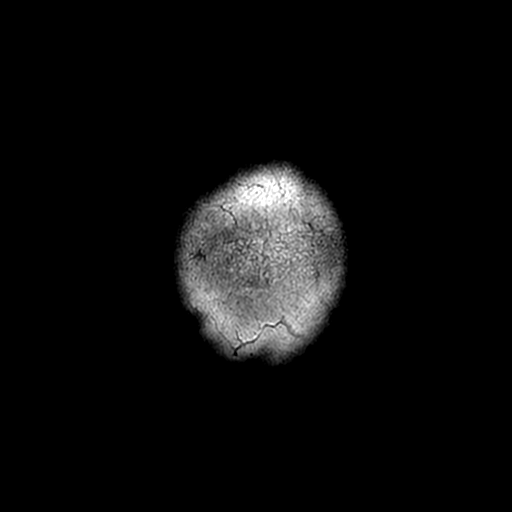

[Series 11: T2 fat-sat · coronal · 4.0mm · 0.35mm/px · 2 of 22 slices shown (1 of 2)]
[im 1/22]
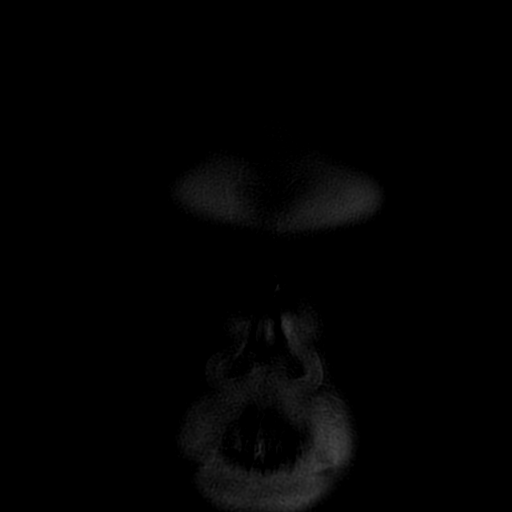
[im 22/22]
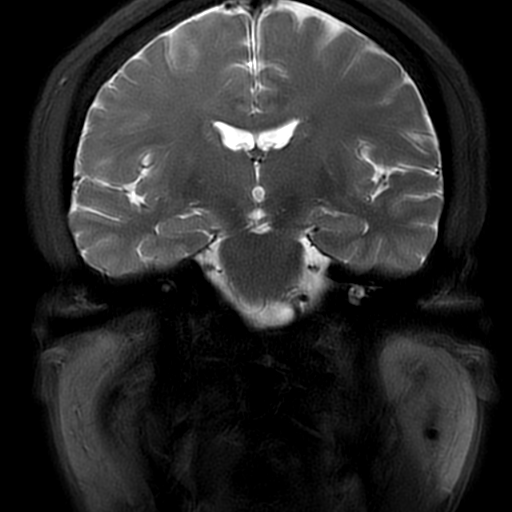

[Series 12: T2 fat-sat · axial · 3.0mm · 0.35mm/px · 1 of 20 slices shown (2 of 2)]
[im 1/20]
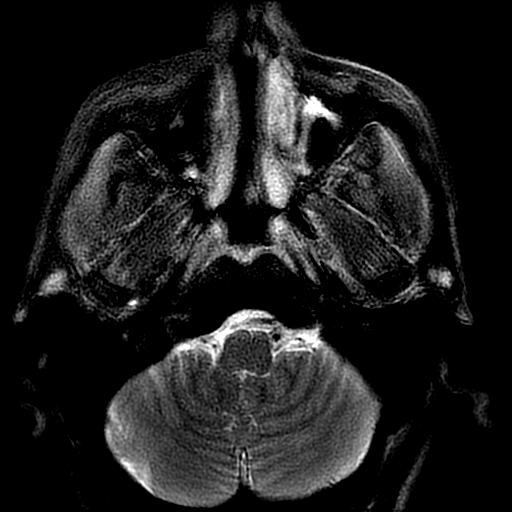

[Series 13: T1 · coronal · 4.0mm · 0.35mm/px · 2 of 22 slices shown]
[im 1/22]
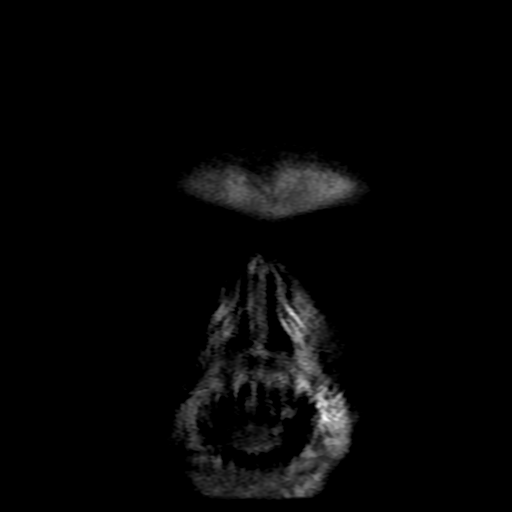
[im 22/22]
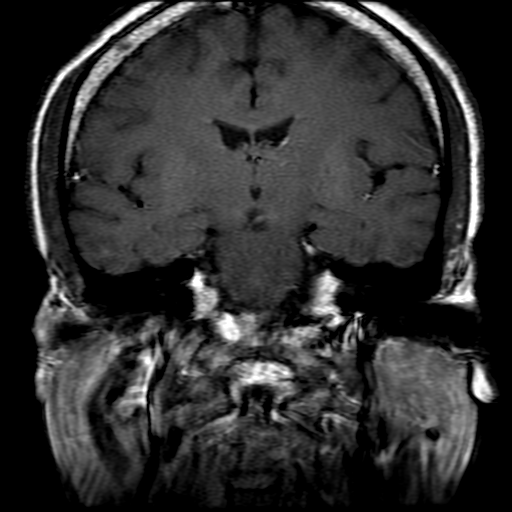

[Series 350: ADC · axial · 3.0mm · 0.94mm/px · z∈[-50,+96]mm · 3 of 49 slices shown (1 of 2)]
[im 1/49]
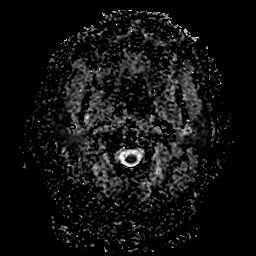
[im 25/49]
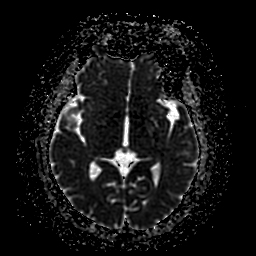
[im 49/49]
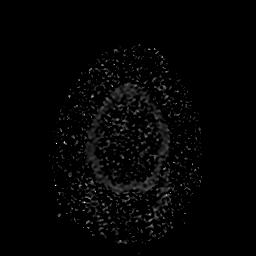

[Series 450: ADC · coronal · 4.0mm · 0.94mm/px · 2 of 36 slices shown (2 of 2)]
[im 1/36]
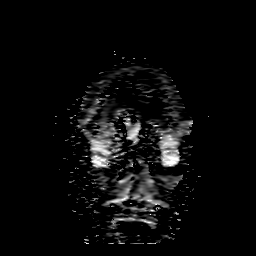
[im 36/36]
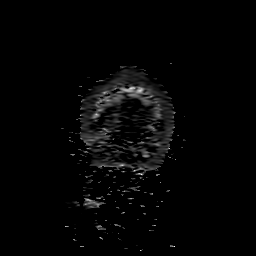

[30 of 48 positions shown; findings below may reference images not displayed]

FINDINGS: MRI HEAD FINDINGS

Brain: Diffusion imaging does not show any acute or subacute
infarction. Cerebellar tonsils extend 2 mm through the foramen
magnum, at the limits of normal and not sufficient for diagnosis
Chiari malformation. The brainstem and cerebellum are otherwise
normal. Cerebral hemispheres are normal without evidence of old or
acute infarction, mass lesion, hemorrhage, hydrocephalus or
extra-axial collection.

Vascular: Major vessels at the base of the brain show flow.

Skull and upper cervical spine: Hypercellular marrow pattern of the
cervical spine, usually benign.

Other: See below for description of sinus disease.

MRI ORBITS FINDINGS

Orbits: Both globes appear normal. The examination suffers from
rather considerable motion degradation, but I believe the optic
nerves are normal. Extra-ocular muscles are normal. Orbital fat is
normal. No abnormality is seen at either orbital apex.

Visualized sinuses: The patient has inflammatory sinus disease of
the right frontal sinus including a completely opacified far lateral
component. Additionally, the right ethmoid sinus is opacified. There
is mucosal thickening of the maxillary sinuses. This sinus disease
could relate to the patient's symptoms.

Soft tissues: Other regional soft tissues appear unremarkable.

Limited intracranial: Negative as above.
IMPRESSION: Normal appearance of the brain itself.

Inflammatory disease of the paranasal sinuses most pronounced
affecting the right frontal and right ethmoid sinuses, which could
relate to the patient's symptoms.

Orbital imaging is graded by motion, but I do not identify any
intraorbital pathology.

## 2019-01-29 ENCOUNTER — Other Ambulatory Visit: Payer: Self-pay

## 2019-01-29 ENCOUNTER — Ambulatory Visit (INDEPENDENT_AMBULATORY_CARE_PROVIDER_SITE_OTHER): Payer: BC Managed Care – PPO

## 2019-01-29 DIAGNOSIS — Z23 Encounter for immunization: Secondary | ICD-10-CM | POA: Diagnosis not present

## 2019-01-29 NOTE — Progress Notes (Signed)
Patient here for second Hepatitis A vaccine. While patient was here BP follow up appointment that was missed in March was rescheduled. KWalker, CMA.

## 2019-02-14 ENCOUNTER — Telehealth: Payer: Self-pay

## 2019-02-14 NOTE — Telephone Encounter (Signed)
Called patient to do their pre-visit COVID screening using [employer], [place].  Interpreter called the mobile number listed & stated that the person who answered didn't seem to understand [place] & hung up the phone. Interpreter called the number listed as home number & received an error message stating that "the number you are attempting to contact is unable to accept incoming calls at this time".

## 2019-02-15 ENCOUNTER — Ambulatory Visit: Payer: Medicaid Other | Admitting: Family Medicine

## 2022-11-06 ENCOUNTER — Ambulatory Visit (HOSPITAL_COMMUNITY)
Admission: EM | Admit: 2022-11-06 | Discharge: 2022-11-06 | Disposition: A | Discharge status: Home / Self Care | Payer: Medicaid Other | Attending: Internal Medicine | Admitting: Internal Medicine

## 2022-11-06 ENCOUNTER — Encounter (HOSPITAL_COMMUNITY): Payer: Self-pay

## 2022-11-06 ENCOUNTER — Telehealth (HOSPITAL_COMMUNITY): Payer: Self-pay | Admitting: Emergency Medicine

## 2022-11-06 DIAGNOSIS — S46912A Strain of unspecified muscle, fascia and tendon at shoulder and upper arm level, left arm, initial encounter: Secondary | ICD-10-CM

## 2022-11-06 MED ORDER — IBUPROFEN 600 MG PO TABS: 600.0000 mg | ORAL_TABLET | Freq: Four times a day (QID) | ORAL | 0 refills | Status: AC | PRN

## 2022-11-06 MED ORDER — METHOCARBAMOL 500 MG PO TABS: 500.0000 mg | ORAL_TABLET | Freq: Two times a day (BID) | ORAL | 0 refills | Status: DC

## 2022-11-06 MED ORDER — IBUPROFEN 600 MG PO TABS: 600.0000 mg | ORAL_TABLET | Freq: Four times a day (QID) | ORAL | 0 refills | Status: DC | PRN

## 2022-11-06 NOTE — Discharge Instructions (Signed)
Your pain is likely due to a muscle strain which will improve on its own with time.   - Take ibuprofen 600mg   with food every 6 hours as needed for pain and inflammation. Do not take any other NSAID containing medicine when taking ibuprofen.   - You may also take the prescribed muscle relaxer as directed as needed for muscle aches/spasm.  Do not take this medication and drive or drink alcohol as it can make you sleepy.  Mainly use this medicine at nighttime as needed. - Apply heat 20 minutes on then 20 minutes off and perform gentle range of motion exercises to the area of greatest pain to prevent muscle stiffness and provide further pain relief.   Schedule an appointment with the orthopedic doctor listed on your paperwork as needed.  Red flag symptoms to watch out for are numbness/tingling to the legs, weakness, loss of bowel/bladder control, and/or worsening pain that does not respond well to medicines. Follow-up with your primary care provider or return to urgent care if your symptoms do not improve in the next 3 to 4 days with medications and interventions recommended today. If your symptoms are severe (red flag), please go to the emergency room.  I hope you feel better!

## 2022-11-06 NOTE — ED Triage Notes (Signed)
Pt presents to office for back pain x 1 month. 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

## 2022-11-06 NOTE — ED Provider Notes (Addendum)
[place]    CSN: [id number] Arrival date & time: 11/06/22  1015      History   Chief Complaint Chief Complaint  Patient presents with   Shoulder Pain    HPI Andre Wright is a 57 y.o. male.   Patient presents to urgent care for evaluation of left shoulder pain that started last month.  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  No recent trauma/injury to the shoulder to his knowledge.  He is currently experiencing discomfort to the posterior left shoulder that is much worse with movement.  Denies neck pain, redness/warmth to the left shoulder, numbness and tingling to the bilateral upper extremities, and extremity weakness. He was given ibuprofen at his job and states that this helped a lot with the pain.  His manager at work requested that he come to urgent care to get a note stating that he cannot perform his job duties due to his shoulder pain.   The history is provided by the patient and a relative. The history is limited by a language barrier. A language interpreter was used (Swahili video interpreter used).  Shoulder Pain   Past Medical History:  Diagnosis Date   Hypertension     There are no problems to display for this patient.   Past Surgical History:  Procedure Laterality Date   NO PAST SURGERIES         Home Medications    Prior to Admission medications   Medication Sig Start Date End Date Taking? Authorizing Provider  fluticasone (FLONASE) 50 MCG/ACT nasal spray Place 1 spray into both nostrils daily. 06/13/18   Scot Jun, NP  ibuprofen (ADVIL) 600 MG tablet Take 1 tablet (600 mg total) by mouth every 6 (six) hours as needed. 11/06/22   Talbot Grumbling, FNP  lisinopril (PRINIVIL,ZESTRIL) 30 MG tablet Take 1 tablet (30 mg total) by mouth daily. 09/26/18   Scot Jun, NP   methocarbamol (ROBAXIN) 500 MG tablet Take 1 tablet (500 mg total) by mouth 2 (two) times daily. 11/06/22   Talbot Grumbling, FNP  naproxen (NAPROSYN) 500 MG tablet Take 1 tablet (500 mg total) by mouth 2 (two) times daily. (Kumeza moja kidonge kila asubuhi na kila usiku kwa siku kumi.) 07/20/18   Vanessa Kick, MD  sildenafil (VIAGRA) 50 MG tablet Take 1 tablet by mouth 1-4 hours before sexual activity. Alejandro Mulling moja kidonge 1-4 masaa kabla ya shughuli ya ngono.) 07/20/18   Vanessa Kick, MD    Family History Family History  Problem Relation Age of Onset   Healthy Mother    Healthy Father    Stroke Neg Hx    Heart disease Neg Hx    Cancer Neg Hx     Social History Social History   Tobacco Use   Smoking status: Never   Smokeless tobacco: Never  Substance Use Topics   Alcohol use: Never   Drug use: Never     Allergies   Patient has no known allergies.   Review of Systems Review of Systems Per HPI  Physical Exam Triage Vital Signs ED Triage Vitals  Enc Vitals Group     BP 11/06/22 1122 (!) 152/92     Pulse Rate 11/06/22 1122 63     Resp 11/06/22 1122 16     Temp 11/06/22 1122 98.6 F (37 C)  Temp Source 11/06/22 1122 Oral     SpO2 11/06/22 1122 98 %     Weight --      Height --      Head Circumference --      Peak Flow --      Pain Score 11/06/22 1129 6     Pain Loc --      Pain Edu? --      Excl. in [place]? --    No data found.  Updated Vital Signs BP (!) 152/92 (BP Location: Left Arm)   Pulse 63   Temp 98.6 F (37 C) (Oral)   Resp 16   SpO2 98%   Visual Acuity Right Eye Distance:   Left Eye Distance:   Bilateral Distance:    Right Eye Near:   Left Eye Near:    Bilateral Near:     Physical Exam Vitals and nursing note reviewed.  Constitutional:      Appearance: He is not ill-appearing or toxic-appearing.  HENT:     Head: Normocephalic and atraumatic.     Right Ear: Hearing and external ear normal.     Left Ear: Hearing and external  ear normal.     Nose: Nose normal.     Mouth/Throat:     Lips: Pink.  Eyes:     General: Lids are normal. Vision grossly intact. Gaze aligned appropriately.     Extraocular Movements: Extraocular movements intact.     Conjunctiva/sclera: Conjunctivae normal.  Cardiovascular:     Rate and Rhythm: Normal rate and regular rhythm.     Heart sounds: Normal heart sounds, S1 normal and S2 normal.  Pulmonary:     Effort: Pulmonary effort is normal. No respiratory distress.     Breath sounds: Normal breath sounds and air entry.  Musculoskeletal:     Right shoulder: Normal.     Left shoulder: Tenderness present. No swelling, deformity, effusion, laceration, bony tenderness or crepitus. Normal range of motion. Normal strength. Normal pulse.     Cervical back: Neck supple.     Comments: Normal range of motion of the left upper extremity.  Tender to palpation to the trigger of the posterior left shoulder.  Strength and sensation intact bilateral upper extremities.  Normal head movement at the neck without pain or crepitus.  No tenderness to the C, T, or L-spine.  No rash.  Skin:    General: Skin is warm and dry.     Capillary Refill: Capillary refill takes less than 2 seconds.     Findings: No rash.  Neurological:     General: No focal deficit present.     Mental Status: He is alert and oriented to person, place, and time. Mental status is at baseline.     Cranial Nerves: No dysarthria or facial asymmetry.  Psychiatric:        Mood and Affect: Mood normal.        Speech: Speech normal.        Behavior: Behavior normal.        Thought Content: Thought content normal.        Judgment: Judgment normal.      UC Treatments / Results  Labs (all labs ordered are listed, but only abnormal results are displayed) Labs Reviewed - No data to display  EKG   Radiology No results found.  Procedures Procedures (including critical care time)  Medications Ordered in UC Medications - No data to  display  Initial Impression / Assessment and Plan /  UC Course  I have reviewed the triage vital signs and the nursing notes.  Pertinent labs & imaging results that were available during my care of the patient were reviewed by me and considered in my medical decision making (see chart for details).   1.  Strain of left shoulder Presentation is consistent with acute muscle strain of the left shoulder due to overuse injury that will likely resolve with rest, fluids, as needed use of ibuprofen and muscle relaxer, heat, and gentle range of motion exercises. May take ibuprofen 600 mg every 6 hours and extend muscle relaxer every 12 hours as needed for muscle spasm. Drowsiness precautions regarding muscle relaxer use discussed. Heat and gentle ROM exercises discussed. Deferred imaging today based on stable musculoskeletal exam findings and hemodynamically stable vital signs. Walking referral given to orthopedic provider should symptoms fail to improve in the next 1-2 weeks.   Walking referral to orthopedics provided.  May call them to schedule an appointment and discuss work restrictions further as we are unable to provide this to urgent care due to lack of follow-up.   Discussed physical exam and available lab work findings in clinic with patient.  Counseled patient regarding appropriate use of medications and potential side effects for all medications recommended or prescribed today. Discussed red flag signs and symptoms of worsening condition,when to call the PCP office, return to urgent care, and when to seek higher level of care in the emergency department. Patient verbalizes understanding and agreement with plan. All questions answered. Patient discharged in stable condition.    Final Clinical Impressions(s) / UC Diagnoses   Final diagnoses:  Strain of left shoulder, initial encounter     Discharge Instructions      Your pain is likely due to a muscle strain which will improve on its own  with time.   - Take ibuprofen 600mg   with food every 6 hours as needed for pain and inflammation. Do not take any other NSAID containing medicine when taking ibuprofen.   - You may also take the prescribed muscle relaxer as directed as needed for muscle aches/spasm.  Do not take this medication and drive or drink alcohol as it can make you sleepy.  Mainly use this medicine at nighttime as needed. - Apply heat 20 minutes on then 20 minutes off and perform gentle range of motion exercises to the area of greatest pain to prevent muscle stiffness and provide further pain relief.   Schedule an appointment with the orthopedic doctor listed on your paperwork as needed.  Red flag symptoms to watch out for are numbness/tingling to the legs, weakness, loss of bowel/bladder control, and/or worsening pain that does not respond well to medicines. Follow-up with your primary care provider or return to urgent care if your symptoms do not improve in the next 3 to 4 days with medications and interventions recommended today. If your symptoms are severe (red flag), please go to the emergency room.  I hope you feel better!       ED Prescriptions     Medication Sig Dispense Auth. Provider   ibuprofen (ADVIL) 600 MG tablet Take 1 tablet (600 mg total) by mouth every 6 (six) hours as needed. 30 tablet Talbot Grumbling, FNP   methocarbamol (ROBAXIN) 500 MG tablet Take 1 tablet (500 mg total) by mouth 2 (two) times daily. 20 tablet Talbot Grumbling, FNP      PDMP not reviewed this encounter.   Talbot Grumbling, [place] 11/06/22 1235  Talbot Grumbling, [place] 11/06/22 1235

## 2022-12-13 ENCOUNTER — Encounter: Payer: Self-pay | Admitting: Family Medicine

## 2022-12-13 ENCOUNTER — Ambulatory Visit (INDEPENDENT_AMBULATORY_CARE_PROVIDER_SITE_OTHER): Payer: Medicaid Other | Admitting: Family Medicine

## 2022-12-13 VITALS — BP 161/93 | HR 69 | Temp 98.1°F | Resp 16 | Ht 67.0 in | Wt 180.4 lb

## 2022-12-13 DIAGNOSIS — Z789 Other specified health status: Secondary | ICD-10-CM

## 2022-12-13 DIAGNOSIS — Z7689 Persons encountering health services in other specified circumstances: Secondary | ICD-10-CM

## 2022-12-13 DIAGNOSIS — M25512 Pain in left shoulder: Secondary | ICD-10-CM

## 2022-12-13 DIAGNOSIS — I10 Essential (primary) hypertension: Secondary | ICD-10-CM | POA: Diagnosis not present

## 2022-12-13 MED ORDER — LISINOPRIL 30 MG PO TABS: 30.0000 mg | ORAL_TABLET | Freq: Every day | ORAL | 1 refills | Status: DC

## 2022-12-13 NOTE — Progress Notes (Unsigned)
Patient is here to re-established care with provider today. Patient has many health concern they would like to discuss with provider today  Care gaps discuss at appointment today

## 2022-12-13 NOTE — Progress Notes (Unsigned)
New Patient Office Visit  Subjective    Patient ID: Andre Wright, male    DOB: [birth year]  Age: 57 y.o. MRN: [id number]  CC: No chief complaint on file.   HPI Andre Wright presents to establish care and for review of chronic med issues. This visit was aided by an interpreter.    Outpatient Encounter Medications as of 12/13/2022  Medication Sig   fluticasone (FLONASE) 50 MCG/ACT nasal spray Place 1 spray into both nostrils daily.   ibuprofen (ADVIL) 600 MG tablet Take 1 tablet (600 mg total) by mouth every 6 (six) hours as needed.   lisinopril (PRINIVIL,ZESTRIL) 30 MG tablet Take 1 tablet (30 mg total) by mouth daily.   methocarbamol (ROBAXIN) 500 MG tablet Take 1 tablet (500 mg total) by mouth 2 (two) times daily.   naproxen (NAPROSYN) 500 MG tablet Take 1 tablet (500 mg total) by mouth 2 (two) times daily. (Kumeza moja kidonge kila asubuhi na kila usiku kwa siku kumi.)   sildenafil (VIAGRA) 50 MG tablet Take 1 tablet by mouth 1-4 hours before sexual activity. (Kumeza moja kidonge 1-4 masaa kabla ya shughuli ya ngono.)   No facility-administered encounter medications on file as of 12/13/2022.    Past Medical History:  Diagnosis Date   Hypertension     Past Surgical History:  Procedure Laterality Date   NO PAST SURGERIES      Family History  Problem Relation Age of Onset   Healthy Mother    Healthy Father    Stroke Neg Hx    Heart disease Neg Hx    Cancer Neg Hx     Social History   Socioeconomic History   Marital status: Married    Spouse name: Not on file   Number of children: Not on file   Years of education: Not on file   Highest education level: Not on file  Occupational History   Not on file  Tobacco Use   Smoking status: Never   Smokeless tobacco: Never  Substance and Sexual Activity   Alcohol use: Never   Drug use: Never   Sexual activity: Yes  Other Topics Concern   Not on file  Social History Narrative   Not on file   Social  Determinants of Health   Financial Resource Strain: Not on file  Food Insecurity: Not on file  Transportation Needs: Not on file  Physical Activity: Not on file  Stress: Not on file  Social Connections: Not on file  Intimate Partner Violence: Not on file    Review of Systems  All other systems reviewed and are negative.       Objective    BP (!) 161/93   Pulse 69   Temp 98.1 F (36.7 C) (Oral)   Resp 16   Ht 5\' 7"  (1.702 m)   Wt 180 lb 6.4 oz (81.8 kg)   SpO2 97%   BMI 28.25 kg/m   Physical Exam Vitals and nursing note reviewed.  Constitutional:      General: He is not in acute distress. Cardiovascular:     Rate and Rhythm: Normal rate and regular rhythm.  Pulmonary:     Effort: Pulmonary effort is normal.     Breath sounds: Normal breath sounds.  Abdominal:     Palpations: Abdomen is soft.     Tenderness: There is no abdominal tenderness.  Musculoskeletal:     Left shoulder: Tenderness present. No deformity. Decreased range of motion.  Neurological:     General:  No focal deficit present.     Mental Status: He is alert and oriented to person, place, and time.         Assessment & Plan:   1. Essential hypertension Elevated readings. Meds refilled. Discussed compliance.   2. Left shoulder pain, unspecified chronicity Referral to ortho for further eval/mgt - Ambulatory referral to Orthopedics  3. Encounter to establish care   4. Language barrier to communication     No follow-ups on file.   Tommie Raymond, MD

## 2022-12-16 ENCOUNTER — Encounter: Payer: Self-pay | Admitting: Family Medicine

## 2022-12-29 ENCOUNTER — Ambulatory Visit (INDEPENDENT_AMBULATORY_CARE_PROVIDER_SITE_OTHER): Payer: Medicaid Other | Admitting: Orthopedic Surgery

## 2022-12-29 ENCOUNTER — Other Ambulatory Visit (INDEPENDENT_AMBULATORY_CARE_PROVIDER_SITE_OTHER): Payer: Medicaid Other

## 2022-12-29 ENCOUNTER — Encounter: Payer: Self-pay | Admitting: Orthopedic Surgery

## 2022-12-29 ENCOUNTER — Other Ambulatory Visit: Payer: Self-pay

## 2022-12-29 DIAGNOSIS — M25512 Pain in left shoulder: Secondary | ICD-10-CM

## 2022-12-29 DIAGNOSIS — G8929 Other chronic pain: Secondary | ICD-10-CM

## 2022-12-29 NOTE — Progress Notes (Signed)
Office Visit Note   Patient: Andre Wright           Date of Birth: 18-Oct-1965           MRN: [id number] Visit Date: 12/29/2022 Requested by: Georganna Skeans, MD [address] [place],  [place] [address] PCP: Georganna Skeans, MD  Subjective: Chief Complaint  Patient presents with   Left Shoulder - Pain    HPI: Andre Wright is a 57 y.o. male who presents to the office reporting left shoulder and arm pain for 7 months.  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  Using ibuprofen with mild relief.  Really it is the work that bothers him.  Has less pain at home when he is not working.  States the pain runs down the arm.  Also reports scapular pain.  Denies any numbness and tingling.  He does report a lot of weakness in that left arm.  Has had no prior surgery to the neck or shoulder.  Here with a translator today..                ROS: All systems reviewed are negative as they relate to the chief complaint within the history of present illness.  Patient denies fevers or chills.  Assessment & Plan: Visit Diagnoses:  1. Chronic left shoulder pain     Plan: Impression is difficult exam and visit due to the language barrier; however, I think there is a possible radicular component to his symptoms particularly with the weakness and I think there may also be a shoulder issue.  Radiographs of the shoulder unremarkable but on the neck there is some loss of lordosis without much arthritis.  I think it is most likely he has a radicular component contributing to his relative weakness in the arm.  Again through the translator it is difficult to get a great exam for strength testing comparing left arm to right arm.  Nonetheless I do think he is got some biceps weakness a little triceps weakness as well as abduction weakness.  This is all on the left side compared to the right.  Plan for MRI cervical spine to evaluate left-sided  radiculopathy with 7 months of symptoms and failure of conservative treatment.  Also needs MRI arthrogram of the left shoulder to evaluate for possible rotator cuff tear which is really based more on his history and his work mechanisms of lifting.  Follow-up after those studies.  I did give him a work note which basically states no overhead work with the left arm for 4 weeks.  Follow-Up Instructions: No follow-ups on file.   Orders:  Orders Placed This Encounter  Procedures   XR Shoulder Left   No orders of the defined types were placed in this encounter.     Procedures: No procedures performed   Clinical Data: No additional findings.  Objective: Vital Signs: There were no vitals taken for this visit.  Physical Exam:  Constitutional: Patient appears well-developed HEENT:  Head: Normocephalic Eyes:EOM are normal Neck: Normal range of motion Cardiovascular: Normal rate Pulmonary/chest: Effort normal Neurologic: Patient is alert Skin: Skin is warm Psychiatric: Patient has normal mood and affect  Ortho Exam: Ortho exam demonstrates range of motion on the right passive of 80/120/180.  On the left is 80/90/130.  He does have a slightly tender 4 x 4 centimeter lipoma around the left axillary region about 8 fingerbreadths below the actual  axilla.  His external rotation strength is symmetric between arms at 15 degrees of abduction.  Biceps is 4 out of 5 on the left 5 out of 5 on the right.  Wrist flexion and extension also 4 out of 5 on the left compared to 5 out of 5 on the right.  He has active forward flexion and abduction below 90 degrees on that left-hand side.  I cannot really get him to relax enough to feel if there is any crepitus in the shoulder joint with internal and external rotation at 90 degrees of abduction on the left.  Specialty Comments:  No specialty comments available.  Imaging: XR Shoulder Left  Result Date: 12/29/2022 AP axillary outlet radiographs left  shoulder reviewed.  No acute fracture.  No glenohumeral joint arthritis.  Mild to moderate AC joint arthritis present.  Acromiohumeral distance maintained.  Visualized lung fields clear    PMFS History: There are no problems to display for this patient.  Past Medical History:  Diagnosis Date   Hypertension     Family History  Problem Relation Age of Onset   Healthy Mother    Healthy Father    Stroke Neg Hx    Heart disease Neg Hx    Cancer Neg Hx     Past Surgical History:  Procedure Laterality Date   NO PAST SURGERIES     Social History   Occupational History   Not on file  Tobacco Use   Smoking status: Never   Smokeless tobacco: Never  Substance and Sexual Activity   Alcohol use: Never   Drug use: Never   Sexual activity: Yes

## 2023-01-06 ENCOUNTER — Other Ambulatory Visit: Payer: Self-pay | Admitting: Orthopedic Surgery

## 2023-01-06 DIAGNOSIS — G8929 Other chronic pain: Secondary | ICD-10-CM

## 2023-01-20 ENCOUNTER — Ambulatory Visit: Payer: Medicaid Other | Admitting: Family Medicine

## 2023-02-15 ENCOUNTER — Telehealth: Payer: Self-pay | Admitting: Family Medicine

## 2023-02-15 NOTE — Telephone Encounter (Signed)
Copied from CRM [id number]. Topic: Referral - Status >> Feb 14, 2023 12:20 PM Ja-Kwan M wrote: Reason for CRM: Pt daughter called to request that an appt be scheduled with the specialist for pt shoulder

## 2023-02-21 ENCOUNTER — Ambulatory Visit: Payer: Medicaid Other | Admitting: Family Medicine

## 2023-02-21 ENCOUNTER — Encounter: Payer: Self-pay | Admitting: Family Medicine

## 2023-02-21 VITALS — BP 154/88 | HR 90 | Temp 98.1°F | Resp 16 | Wt 177.2 lb

## 2023-02-21 DIAGNOSIS — Z789 Other specified health status: Secondary | ICD-10-CM | POA: Diagnosis not present

## 2023-02-21 DIAGNOSIS — I10 Essential (primary) hypertension: Secondary | ICD-10-CM

## 2023-02-21 DIAGNOSIS — M25512 Pain in left shoulder: Secondary | ICD-10-CM

## 2023-02-21 MED ORDER — MELOXICAM 15 MG PO TABS: 15.0000 mg | ORAL_TABLET | Freq: Every day | ORAL | 1 refills | Status: DC

## 2023-02-21 MED ORDER — LISINOPRIL 30 MG PO TABS: 30.0000 mg | ORAL_TABLET | Freq: Every day | ORAL | 1 refills | Status: DC

## 2023-02-21 NOTE — Progress Notes (Unsigned)
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 Patient said pain is 8/10. Patient request letter from provider not to work there or in that area anymore

## 2023-02-23 ENCOUNTER — Encounter: Payer: Self-pay | Admitting: Family Medicine

## 2023-02-23 NOTE — Progress Notes (Signed)
Established Patient Office Visit  Subjective    Patient ID: Andre Wright, male    DOB: April 05, 1966  Age: 57 y.o. MRN: [id number]  CC:  Chief Complaint  Patient presents with   Shoulder Pain    HPI Andre Wright presents to follow up of hypertension. Patient also reports that he has been doing heavy lifting at the chicken plant and his left shoulder has been painful and does not move well. Patient denies known trauma or injury. This visit was aided by an interpreter.    Outpatient Encounter Medications as of 02/21/2023  Medication Sig   meloxicam (MOBIC) 15 MG tablet Take 1 tablet (15 mg total) by mouth daily.   fluticasone (FLONASE) 50 MCG/ACT nasal spray Place 1 spray into both nostrils daily. (Patient not taking: Reported on 02/21/2023)   ibuprofen (ADVIL) 600 MG tablet Take 1 tablet (600 mg total) by mouth every 6 (six) hours as needed. (Patient not taking: Reported on 02/21/2023)   lisinopril (ZESTRIL) 30 MG tablet Take 1 tablet (30 mg total) by mouth daily.   methocarbamol (ROBAXIN) 500 MG tablet Take 1 tablet (500 mg total) by mouth 2 (two) times daily. (Patient not taking: Reported on 02/21/2023)   naproxen (NAPROSYN) 500 MG tablet Take 1 tablet (500 mg total) by mouth 2 (two) times daily. (Kumeza moja kidonge kila asubuhi na kila usiku kwa siku kumi.) (Patient not taking: Reported on 02/21/2023)   sildenafil (VIAGRA) 50 MG tablet Take 1 tablet by mouth 1-4 hours before sexual activity. (Kumeza moja kidonge 1-4 masaa kabla ya shughuli ya ngono.) (Patient not taking: Reported on 02/21/2023)   [DISCONTINUED] lisinopril (ZESTRIL) 30 MG tablet Take 1 tablet (30 mg total) by mouth daily. (Patient not taking: Reported on 02/21/2023)   No facility-administered encounter medications on file as of 02/21/2023.    Past Medical History:  Diagnosis Date   Hypertension     Past Surgical History:  Procedure Laterality Date   NO PAST SURGERIES      Family History  Problem  Relation Age of Onset   Healthy Mother    Healthy Father    Stroke Neg Hx    Heart disease Neg Hx    Cancer Neg Hx     Social History   Socioeconomic History   Marital status: Married    Spouse name: Not on file   Number of children: Not on file   Years of education: Not on file   Highest education level: Not on file  Occupational History   Not on file  Tobacco Use   Smoking status: Never   Smokeless tobacco: Never  Substance and Sexual Activity   Alcohol use: Never   Drug use: Never   Sexual activity: Yes  Other Topics Concern   Not on file  Social History Narrative   Not on file   Social Determinants of Health   Financial Resource Strain: Not on file  Food Insecurity: Not on file  Transportation Needs: Not on file  Physical Activity: Not on file  Stress: Not on file  Social Connections: Not on file  Intimate Partner Violence: Not on file    Review of Systems  All other systems reviewed and are negative.       Objective    BP (!) 154/88   Pulse 90   Temp 98.1 F (36.7 C) (Oral)   Resp 16   Wt 177 lb 3.2 oz (80.4 kg)   SpO2 98%   BMI 27.75 kg/m   Physical  Exam Vitals and nursing note reviewed.  Constitutional:      General: He is not in acute distress. Cardiovascular:     Rate and Rhythm: Normal rate and regular rhythm.  Pulmonary:     Effort: Pulmonary effort is normal.     Breath sounds: Normal breath sounds.  Abdominal:     Palpations: Abdomen is soft.     Tenderness: There is no abdominal tenderness.  Musculoskeletal:     Left shoulder: Tenderness present. No deformity. Decreased range of motion.  Neurological:     General: No focal deficit present.     Mental Status: He is alert and oriented to person, place, and time.         Assessment & Plan:   1. Left shoulder pain, unspecified chronicity Meloxicam prescribed. Exercises given. Patient given exercises and letter for reduced lifting at work.  Patient defers referral to ortho  until follow up.   2. Essential hypertension Elevated readings. Meds refilled. Discussed compliance.   3. Language barrier to communication     Return in about 3 months (around 05/24/2023) for follow up.   Tommie Raymond, MD

## 2023-03-21 ENCOUNTER — Ambulatory Visit: Payer: Self-pay

## 2023-03-21 ENCOUNTER — Ambulatory Visit
Admission: RE | Admit: 2023-03-21 | Discharge: 2023-03-21 | Disposition: A | Discharge status: Home / Self Care | Payer: Medicaid Other | Source: Ambulatory Visit | Attending: Internal Medicine | Admitting: Internal Medicine

## 2023-03-21 VITALS — BP 159/96 | HR 69 | Temp 98.3°F | Resp 16

## 2023-03-21 DIAGNOSIS — M25512 Pain in left shoulder: Secondary | ICD-10-CM | POA: Diagnosis not present

## 2023-03-21 DIAGNOSIS — M19019 Primary osteoarthritis, unspecified shoulder: Secondary | ICD-10-CM

## 2023-03-21 DIAGNOSIS — I10 Essential (primary) hypertension: Secondary | ICD-10-CM

## 2023-03-21 MED ORDER — PREDNISONE 20 MG PO TABS: ORAL_TABLET | ORAL | 0 refills | Status: AC

## 2023-03-21 MED ORDER — CYCLOBENZAPRINE HCL 5 MG PO TABS
5.0000 mg | ORAL_TABLET | Freq: Every day | ORAL | 0 refills | Status: AC
Start: 2023-03-21 — End: ?

## 2023-03-21 MED ORDER — AMLODIPINE BESYLATE 10 MG PO TABS: 10.0000 mg | ORAL_TABLET | Freq: Every day | ORAL | 0 refills | Status: AC

## 2023-03-21 NOTE — Telephone Encounter (Signed)
Chief Complaint: Shoulder Pain Symptoms: left shoulder pain Frequency: Ongoing  Pertinent Negatives: Patient denies injury to shoulder, numbness, weakness Disposition: [] ED /[x] Urgent Care (no appt availability in office) / [] Appointment(In office/virtual)/ []  [facility]/ [] Home Care/ [] Refused Recommended Disposition /[] [facility]/ []  Follow-up with PCP Additional Notes: Patient and daughter both on the line. Patient stated that his left shoulder continues to hurt even after taking ibuprofen. Patient states he believes the shoulder pain is from heavy lifting at his new job. Patient requesting an appointment. Advised patient first available with PCP is 04/08/23. Patient stated he does not know if he can wait that long. Patient has been scheduled at [facility] today at 1845.   Summary: shoulder pain chronic   Pt's daughter called asking for an appt for her father for shoulder pain.  There are no appts until the end of the month.  CB#  [phone]     Reason for Disposition  [1] MILD pain (e.g., does not interfere with normal activities) AND [2] present > 7 days  Answer Assessment - Initial Assessment Questions 1. ONSET: "When did the pain start?"     Ongoing  2. LOCATION: "Where is the pain located?"     Left shoulder pain  3. PAIN: "How bad is the pain?" (Scale 1-10; or mild, moderate, severe)   - MILD (1-3): doesn't interfere with normal activities   - MODERATE (4-7): interferes with normal activities (e.g., work or school) or awakens from sleep   - SEVERE (8-10): excruciating pain, unable to do any normal activities, unable to move arm at all due to pain     7/10 4. WORK OR EXERCISE: "Has there been any recent work or exercise that involved this part of the body?"     Yes lifting heavy stuff at work  5. CAUSE: "What do you think is causing the shoulder pain?"     Work  Protocols used: Shoulder Pain-A-AH

## 2023-03-21 NOTE — Discharge Instructions (Addendum)
Start a 5 day course of prednisone to help with your left shoulder pain. Use a muscle relaxant, cyclobenzaprine, at bedtime.   For your blood pressure, stop lisinopril and start amlodipine instead.

## 2023-03-21 NOTE — Telephone Encounter (Signed)
2nd call attempted  via interpreter ID # [id number], to call patient # [phone] to review sx of shoulder pain. No answer no VM set up, unable to leave message. Called [phone] and patient spouse answered. Left message to have patient call back at #[phone]. Patient at work now.

## 2023-03-21 NOTE — Telephone Encounter (Signed)
Patient called, left VM to return the call to the office to speak to the NT.   Summary: shoulder pain chronic   Pt's daughter called asking for an appt for her father for shoulder pain.  There are no appts until the end of the month.  CB#  [phone]

## 2023-03-21 NOTE — ED Triage Notes (Signed)
Pt reports left shoulder pain for couple months.  Pt reports pain is due to heavy lifting at work at the [employer]. Pt is requesting a letter to be changed his position at work, states last time a doctor gave him a letter and they changed the type or work for 30 days and then they moved him back to the usual work.   States he has eye pain when taking lisinopril.

## 2023-03-21 NOTE — ED Provider Notes (Signed)
[facility]  Note:  This document was prepared using Conservation officer, historic buildings and may include unintentional dictation errors.  MRN: [id number] DOB: 1965/09/17  Subjective:   Andre Wright is a 57 y.o. male presenting for 7-month history of persistent intermittent left shoulder pain, difficulty lifting items with his left arm.  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  He was seen by an orthopedist and had workup done including x-rays which showed arthritis of the left shoulder.  He has not followed up with them.  Previously he got a note for work for 30 days to limit his work activities.  When 30 days were up, patient did not follow-up and patient was moved back to his normal work tasks.  Shortly thereafter, his symptoms returned for the left shoulder.  He also has concern about a swollen area over the left thoracic side.  Would also like to discuss his blood pressure.  Has been taking lisinopril and feels like it irritates his eyes every time he takes it.  No headache, confusion, weakness, numbness or tingling, chest pain, shortness of breath, nausea, vomiting, abdominal pain, hematuria.  He does have a primary care provider, has not followed up with them.  No current facility-administered medications for this encounter.  Current Outpatient Medications:    fluticasone (FLONASE) 50 MCG/ACT nasal spray, Place 1 spray into both nostrils daily. (Patient not taking: Reported on 02/21/2023), Disp: 48 g, Rfl: 1   ibuprofen (ADVIL) 600 MG tablet, Take 1 tablet (600 mg total) by mouth every 6 (six) hours as needed. (Patient not taking: Reported on 02/21/2023), Disp: 30 tablet, Rfl: 0   lisinopril (ZESTRIL) 30 MG tablet, Take 1 tablet (30 mg total) by mouth daily., Disp: 90 tablet, Rfl: 1   meloxicam (MOBIC) 15 MG tablet, Take 1 tablet (15 mg total) by mouth daily., Disp: 90 tablet, Rfl: 1   methocarbamol  (ROBAXIN) 500 MG tablet, Take 1 tablet (500 mg total) by mouth 2 (two) times daily. (Patient not taking: Reported on 02/21/2023), Disp: 20 tablet, Rfl: 0   naproxen (NAPROSYN) 500 MG tablet, Take 1 tablet (500 mg total) by mouth 2 (two) times daily. (Kumeza moja kidonge kila asubuhi na kila usiku kwa siku kumi.) (Patient not taking: Reported on 02/21/2023), Disp: 20 tablet, Rfl: 0   sildenafil (VIAGRA) 50 MG tablet, Take 1 tablet by mouth 1-4 hours before sexual activity. (Kumeza moja kidonge 1-4 masaa kabla ya shughuli ya ngono.) (Patient not taking: Reported on 02/21/2023), Disp: 10 tablet, Rfl: 0   No Known Allergies  Past Medical History:  Diagnosis Date   Hypertension      Past Surgical History:  Procedure Laterality Date   NO PAST SURGERIES      Family History  Problem Relation Age of Onset   Healthy Mother    Healthy Father    Stroke Neg Hx    Heart disease Neg Hx    Cancer Neg Hx     Social History   Tobacco Use   Smoking status: Never   Smokeless tobacco: Never  Vaping Use   Vaping status: Never Used  Substance Use Topics   Alcohol use: Never   Drug use: Never    ROS   Objective:   Vitals: BP (!) 159/96 (BP Location: Right Arm)   Pulse 69   Temp 98.3 F (36.8 C) (Oral)   Resp 16   SpO2 98%  Physical Exam Constitutional:      General: He is not in acute distress.    Appearance: Normal appearance. He is well-developed. He is not ill-appearing, toxic-appearing or diaphoretic.  HENT:     Head: Normocephalic and atraumatic.     Right Ear: External ear normal.     Left Ear: External ear normal.     Nose: Nose normal.     Mouth/Throat:     Mouth: Mucous membranes are moist.  Eyes:     General: No scleral icterus.       Right eye: No discharge.        Left eye: No discharge.     Extraocular Movements: Extraocular movements intact.  Cardiovascular:     Rate and Rhythm: Normal rate and regular rhythm.     Heart sounds: Normal heart sounds. No murmur  heard.    No friction rub. No gallop.  Pulmonary:     Effort: Pulmonary effort is normal. No respiratory distress.     Breath sounds: Normal breath sounds. No stridor. No wheezing, rhonchi or rales.  Musculoskeletal:     Left shoulder: Tenderness (mild over AC joint and lateral posterior deltoids) present. No swelling, deformity, effusion, laceration, bony tenderness or crepitus. Decreased range of motion. Normal strength.  Skin:      Neurological:     Mental Status: He is alert and oriented to person, place, and time.     Cranial Nerves: No cranial nerve deficit.     Motor: No weakness.     Coordination: Coordination normal.     Gait: Gait normal.  Psychiatric:        Mood and Affect: Mood normal.        Behavior: Behavior normal.        Thought Content: Thought content normal.        Judgment: Judgment normal.     Assessment and Plan :   PDMP not reviewed this encounter.  1. Acute pain of left shoulder   2. Shoulder arthritis   3. Essential hypertension    Recommended an oral prednisone course for his shoulder arthritis, shoulder pain likely worsened by the nature of his work.  Recommended switching to a different muscle relaxant.  I emphasized need to follow-up with his orthopedist for further work restrictions and imaging as deemed necessary including consideration for an MRI.  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  Advised to recheck with his PCP for consideration of ultrasound, imaging or referral to dermatology for consideration of biopsy and procedural removal.  Regarding his blood pressure, will have the patient's stop lisinopril and switch to amlodipine.  No signs of hypertensive emergency or urgency.  Counseled patient on potential for adverse effects with medications prescribed/recommended today, ER and return-to-clinic precautions discussed, patient verbalized understanding.    Wallis Bamberg, [place] 03/22/23 [phone]

## 2023-05-24 ENCOUNTER — Ambulatory Visit: Payer: Medicaid Other | Admitting: Family Medicine

## 2023-08-08 ENCOUNTER — Ambulatory Visit: Payer: Medicaid Other | Admitting: Surgical

## 2024-05-30 ENCOUNTER — Encounter: Admitting: Family Medicine
